# Patient Record
Sex: Female | Born: 1975 | Race: White | Hispanic: No | Smoking: Former smoker
Health system: Southern US, Community
[De-identification: ages and names within clinical notes are randomized; demographics above are authoritative.]

## PROBLEM LIST (undated history)

## (undated) DIAGNOSIS — G4730 Sleep apnea, unspecified: Secondary | ICD-10-CM

## (undated) DIAGNOSIS — F419 Anxiety disorder, unspecified: Secondary | ICD-10-CM

## (undated) DIAGNOSIS — G5602 Carpal tunnel syndrome, left upper limb: Secondary | ICD-10-CM

## (undated) DIAGNOSIS — E669 Obesity, unspecified: Secondary | ICD-10-CM

## (undated) DIAGNOSIS — Z98811 Dental restoration status: Secondary | ICD-10-CM

## (undated) DIAGNOSIS — F17200 Nicotine dependence, unspecified, uncomplicated: Secondary | ICD-10-CM

## (undated) DIAGNOSIS — Z973 Presence of spectacles and contact lenses: Secondary | ICD-10-CM

## (undated) HISTORY — DX: Obesity, unspecified: E66.9

## (undated) HISTORY — DX: Anxiety disorder, unspecified: F41.9

## (undated) HISTORY — DX: Presence of spectacles and contact lenses: Z97.3

## (undated) HISTORY — DX: Nicotine dependence, unspecified, uncomplicated: F17.200

---

## 1998-07-16 ENCOUNTER — Other Ambulatory Visit: Admission: RE | Admit: 1998-07-16 | Discharge: 1998-07-16 | Payer: Self-pay | Admitting: Obstetrics and Gynecology

## 2000-08-19 ENCOUNTER — Other Ambulatory Visit: Admission: RE | Admit: 2000-08-19 | Discharge: 2000-08-19 | Payer: Self-pay | Admitting: Obstetrics and Gynecology

## 2001-08-27 ENCOUNTER — Other Ambulatory Visit: Admission: RE | Admit: 2001-08-27 | Discharge: 2001-08-27 | Payer: Self-pay | Admitting: Obstetrics and Gynecology

## 2002-08-29 ENCOUNTER — Other Ambulatory Visit: Admission: RE | Admit: 2002-08-29 | Discharge: 2002-08-29 | Payer: Self-pay | Admitting: Obstetrics and Gynecology

## 2003-08-31 ENCOUNTER — Other Ambulatory Visit: Admission: RE | Admit: 2003-08-31 | Discharge: 2003-08-31 | Payer: Self-pay | Admitting: Obstetrics and Gynecology

## 2004-09-13 ENCOUNTER — Other Ambulatory Visit: Admission: RE | Admit: 2004-09-13 | Discharge: 2004-09-13 | Payer: Self-pay | Admitting: Obstetrics and Gynecology

## 2005-09-30 ENCOUNTER — Other Ambulatory Visit: Admission: RE | Admit: 2005-09-30 | Discharge: 2005-09-30 | Payer: Self-pay | Admitting: Obstetrics and Gynecology

## 2006-04-10 ENCOUNTER — Inpatient Hospital Stay (HOSPITAL_COMMUNITY): Admission: RE | Admit: 2006-04-10 | Discharge: 2006-04-13 | Payer: Self-pay | Admitting: Obstetrics and Gynecology

## 2008-07-14 ENCOUNTER — Ambulatory Visit: Payer: Self-pay | Admitting: Family Medicine

## 2009-06-01 ENCOUNTER — Ambulatory Visit: Payer: Self-pay | Admitting: Family Medicine

## 2010-12-27 NOTE — Discharge Summary (Signed)
[name] Silva, [name] Silva                ACCOUNT NO.:  [id number]   MEDICAL RECORD NO.:  [id number]          PATIENT TYPE:  INP   LOCATION:  9118                          FACILITY:  [facility]   PHYSICIAN:  Malachi Pro. Ambrose Mantle, M.D. DATE OF BIRTH:  1975-08-31   DATE OF ADMISSION:  04/10/2006  DATE OF DISCHARGE:  04/13/2006                                 DISCHARGE SUMMARY   A 35 year old white female at [redacted] weeks gestation and a breech presentation  with gestational hypertension, admitted for C-section.  The patient  underwent a low transverse cervical c-section by Dr. Jackelyn Knife, with Dr.  Ambrose Mantle assisting under spinal anesthesia with delivery of an 8-pound 1 ounce  female, with Apgars of 9 at 1 and 9 at 5 minutes.  Postpartum, the patient  did quite well.  She had no significant problems and was discharged on the  third post-op day, voiding well, passing flatus, tolerating a diet,  ambulating well.  Incision was clean and dry.  Staples were removed and  strips applied.   LABORATORY DATA:  Showed initial hemoglobin of 12.2, hematocrit 35.5, white  count 7,400, platelet count 293,000.  Creatinine was 1.0, uric acid 6.4.  Liver enzymes were normal.  LDH was 160, platelet count 293,000.  SGOT and  PT were both normal.  RPR was non-reactive.  Followup hemoglobin was 11.6.  TIH labs on the first post-op day were normal.   FINAL DIAGNOSES:  1. Intrauterine pregnancy at 39 weeks.  2. Breech presentation.  3. Pregnancy-induced high blood pressure.   OPERATION:  Low-transverse cervical c-section.   FINAL CONDITION:  Improved.  Instructions include our regular discharge  instructions.  Call with any fever above 100.4 degrees.  Call with any  unusual problems or heavy bleeding.  Refrain from heaving lifting and return  to the office in 10 days for followup examination.  Percocet 5/325, 24  tablets, one every 4 to 6 hours as needed for pain as given at discharge.      Malachi Pro. Ambrose Mantle, M.D.  Electronically Signed     TFH/MEDQ  D:  04/13/2006  T:  04/13/2006  Job:  [id number]

## 2010-12-27 NOTE — Op Note (Signed)
[name] Silva, [name] Silva                ACCOUNT NO.:  [id number]   MEDICAL RECORD NO.:  [id number]          PATIENT TYPE:  INP   LOCATION:  9199                          FACILITY:  [facility]   PHYSICIAN:  Leighton Roach Meisinger, M.D.DATE OF BIRTH:  Jun 13, 1976   DATE OF PROCEDURE:  04/10/2006  DATE OF DISCHARGE:                                 OPERATIVE REPORT   PREOPERATIVE DIAGNOSIS:  Intrauterine pregnancy at 39 weeks, breech  presentation, and gestational hypertension.   POSTOPERATIVE DIAGNOSIS:  Intrauterine pregnancy at 39 weeks, breech  presentation, and gestational hypertension.   PROCEDURE:  Primary low transverse cesarean section.   SURGEON:  Zenaida Niece, M.D.   ASSISTANT:  Malachi Pro. Ambrose Mantle, M.D.   ANESTHESIA:  Spinal.   SPECIMENS:  Placenta to cord blood collection and then to labor and  delivery.   ESTIMATED BLOOD LOSS:  800 mL.   COMPLICATIONS:  None.   FINDINGS:  The patient had normal anatomy, delivered a viable female infant  that was in the frank breech presentation with Apgars of 9 and 9, weight 8  pounds 1 ounce.   PROCEDURE IN DETAIL:  The patient was taken to the operating room and placed  in the sitting position.  Dr. Tacy Dura instilled spinal anesthesia and she was  placed in the dorsal supine position with left lateral tilt.  The abdomen  was prepped and draped in the usual sterile fashion and a Foley catheter was  inserted.  The level of her anesthesia was found to be adequate and the  abdomen was entered via a standard Pfannenstiel incision.  A disposable self-  retaining retractor was then placed.  A 4 cm transverse incision was then  made in the lower uterine segment.  A very superficial laceration was made  in the baby's right lateral side.  Once the uterus was entered, the incision  was extended bilaterally and digitally.  The baby was delivered in the frank  breech presentation.  Arms were swept across the chest easily and head  easily delivered.  The  cord was doubly clamped and cut and the mouth and  nares were suctioned and the infant handed to the awaiting pediatric team.  Cord blood was obtained.  The placenta delivered spontaneous and was sent  for cord blood collection.  The uterus was wiped dry with a clean lap pad  and all clots and debris removed.  The uterine incision was inspected and  found to be free of extensions.  The uterine incision was then closed in two  layers with the first layer being a running locking layer with #1 chromic  and the second layer being an imbricating layer, also with #1 chromic.  Adequate hemostasis was achieved.  The tubes and ovaries were inspected and  found to be normal.  The uterine incision was again inspected and found to  be hemostatic.  The subfascial space was then irrigated and made hemostatic  with electrocautery.  The fascia was closed in a running fashion starting at  both ends and meeting in the middle with 0 Vicryl.  The subcutaneous tissue  was then irrigated and made hemostatic with electrocautery.  This was then  closed with running 2-0 plain gut suture.  The skin was then closed with staples followed by a sterile dressing.  The  patient tolerated the procedure well and was taken to the recovery room in  stable condition.  Counts were correct x2.  She received Ancef 1 gram IV  after cord clamp.      Zenaida Niece, M.D.  Electronically Signed     TDM/MEDQ  D:  04/10/2006  T:  04/10/2006  Job:  [id number]

## 2010-12-27 NOTE — H&P (Signed)
[name] Silva, [name] Silva                ACCOUNT NO.:  [id number]   MEDICAL RECORD NO.:  [id number]          PATIENT TYPE:  INP   LOCATION:  NA                            FACILITY:  [facility]   PHYSICIAN:  Zenaida Niece, M.D.DATE OF BIRTH:  1976-01-29   DATE OF ADMISSION:  04/10/2006  DATE OF DISCHARGE:                                HISTORY & PHYSICAL   CHIEF COMPLAINT:  Primary cesarean section for breech presentation.   HISTORY OF PRESENT ILLNESS:  This is a 35 year old white female gravida 1,  para 0 with an EGA of [redacted] weeks by a 10-week ultrasound with a due date of  September 7th who presents for a primary cesarean section due to persistent  breech presentation.  Patient declined external cephalic version, and the  baby remained in breech presentation.  Prenatal care was essentially  uncomplicated, except for the diagnosis of breech.  She has had  intermittently elevated blood pressures, most recently 140s/90s on the day  prior to surgery.  She also had trace to 1+ proteinuria at that time.  PIH  labs were drawn with her hospital labs and reveal an elevated uric acid and  creatinine but normal platelets and normal liver function.  She was having  no symptoms.   PRENATAL LABS:  Blood type is A positive with a negative antibody screen,  RPR nonreactive, rubella immune, hepatitis B surface antigen negative, HIV  negative, gonorrhea and chlamydia negative.  One-hour Glucola was 88, and  group B strep is positive.   PAST MEDICAL HISTORY:  Negative.   PAST SURGICAL HISTORY:  Negative.   ALLERGIES:  AUGMENTIN WHICH CAUSES A RASH.   CURRENT MEDICATIONS:  None.   SOCIAL HISTORY:  Patient is married and does smoke about a half a pack of  cigarettes a day.   FAMILY HISTORY:  Noncontributory.   PHYSICAL EXAMINATION:  VITAL SIGNS:  Weight is 285 pounds, blood pressure is  140/98.  GENERAL:  This is an obese white female, gravid, in no acute distress.  NECK:  Supple without  lymphadenopathy or thyromegaly.  LUNGS:  Clear to auscultation.  HEART:  Regular rate and rhythm without murmur.  ABDOMEN:  Gravid, nontender with a fundal height of 40 cm.  EXTREMITIES:  1+ edema and are nontender, and DTR's are 2/4 and symmetric.  CERVIX:  1, 50, -3 and a breech presentation.   ASSESSMENT:  1. Intrauterine pregnancy of 39 weeks.  2. Persistent breech presentation.  Patient declines external cephalic      version and wishes to proceed with cesarean section.  Risks of cesarean      section have been discussed.  3. Gestational hypertension.   PLAN:  To admit the patient on the day of surgery for primary low transverse  cesarean section for breech presentation.  If her blood pressures remain  elevated, she may require magnesium sulfate after delivery.      Zenaida Niece, M.D.  Electronically Signed     TDM/MEDQ  D:  04/09/2006  T:  04/09/2006  Job:  [id number]

## 2011-05-19 ENCOUNTER — Encounter: Payer: Self-pay | Admitting: Family Medicine

## 2011-05-20 ENCOUNTER — Ambulatory Visit (INDEPENDENT_AMBULATORY_CARE_PROVIDER_SITE_OTHER): Payer: PRIVATE HEALTH INSURANCE | Admitting: Medical

## 2011-05-20 ENCOUNTER — Encounter: Payer: Self-pay | Admitting: Medical

## 2011-05-20 VITALS — BP 140/90 | HR 60 | Temp 97.8°F | Resp 20 | Ht 66.5 in | Wt 257.0 lb

## 2011-05-20 DIAGNOSIS — Z23 Encounter for immunization: Secondary | ICD-10-CM

## 2011-05-20 DIAGNOSIS — B354 Tinea corporis: Secondary | ICD-10-CM

## 2011-05-20 DIAGNOSIS — R030 Elevated blood-pressure reading, without diagnosis of hypertension: Secondary | ICD-10-CM

## 2011-05-20 DIAGNOSIS — F17200 Nicotine dependence, unspecified, uncomplicated: Secondary | ICD-10-CM

## 2011-05-20 DIAGNOSIS — E669 Obesity, unspecified: Secondary | ICD-10-CM

## 2011-05-20 DIAGNOSIS — Z0000 Encounter for general adult medical examination without abnormal findings: Secondary | ICD-10-CM

## 2011-05-20 DIAGNOSIS — F430 Acute stress reaction: Secondary | ICD-10-CM

## 2011-05-20 LAB — COMPREHENSIVE METABOLIC PANEL
ALT: 14 U/L (ref 0–35)
AST: 18 U/L (ref 0–37)
Albumin: 4.4 g/dL (ref 3.5–5.2)
BUN: 17 mg/dL (ref 6–23)
Calcium: 9.7 mg/dL (ref 8.4–10.5)
Chloride: 103 mEq/L (ref 96–112)
Potassium: 4.2 mEq/L (ref 3.5–5.3)

## 2011-05-20 LAB — CBC WITH DIFFERENTIAL/PLATELET
Basophils Absolute: 0 10*3/uL (ref 0.0–0.1)
Basophils Relative: 0 % (ref 0–1)
Hemoglobin: 14.8 g/dL (ref 12.0–15.0)
MCHC: 32.7 g/dL (ref 30.0–36.0)
Neutro Abs: 6.6 10*3/uL (ref 1.7–7.7)
Neutrophils Relative %: 70 % (ref 43–77)
RDW: 12.9 % (ref 11.5–15.5)

## 2011-05-20 LAB — POCT URINALYSIS DIPSTICK
Bilirubin, UA: NEGATIVE
Glucose, UA: NEGATIVE
Ketones, UA: NEGATIVE
Nitrite, UA: NEGATIVE
Spec Grav, UA: 1.025

## 2011-05-20 LAB — LIPID PANEL
Cholesterol: 237 mg/dL — ABNORMAL HIGH (ref 0–200)
Triglycerides: 108 mg/dL (ref <150)

## 2011-05-20 MED ORDER — KETOCONAZOLE 2 % EX CREA: TOPICAL_CREAM | Freq: Every day | CUTANEOUS | Status: DC

## 2011-05-20 NOTE — Progress Notes (Signed)
Subjective:   HPI  [name] Silva is a 35 y.o. female who presents for a complete physical.  Last complete physical 10 years ago, but does see gynecology regularly.    She notes problems with anxiety.  She became tearful discussing.  She notes stressors include finances, being a parent, life stressors in general.  She notes that she is a worry wart.  She does not want to be on medication, but needs help dealing with stress.  No prior treatment, eval, or counseling.    She notes gynecologist sent her to urology for hematuria, and workup showed no worrisome findings.  Had ultrasound and other studies, 2 years ago with [facility].  Sees Dr. Jackelyn Knife at [facility], had normal pap smear in June.   Last tetanus booster possibly more than 10 years ago.   Declines flu vaccine.    Reviewed their medical, surgical, family, social, medication, and allergy history and updated chart as appropriate.  Past Medical History  Diagnosis Date  . Smoker   . Wears glasses   . Obesity     Past Surgical History  Procedure Date  . Cesarean section     Family History  Problem Relation Age of Onset  . COPD Mother   . Lumbar disc disease Mother   . Cancer Mother     cervical cancer  . Diabetes Father   . Hypertension Father   . Hyperlipidemia Father   . COPD Maternal Uncle     lung  . Stroke Maternal Grandmother   . Heart disease Maternal Grandfather     History   Social History  . Marital Status: Single    Spouse Name: N/A    Number of Children: N/A  . Years of Education: N/A   Occupational History  . Not on file.   Social History Main Topics  . Smoking status: Current Everyday Smoker -- 1.0 packs/day for 15 years  . Smokeless tobacco: Never Used  . Alcohol Use: 0.5 oz/week    1 drink(s) per week  . Drug Use: No  . Sexually Active: Not on file   Other Topics Concern  . Not on file   Social History Narrative   Married, 1 child, not exercising, works in office, Corporate treasurer at  [employer]    No Known Allergies   Review of Systems Constitutional: denies fever, chills, sweats, unexpected weight change, anorexia, fatigue Allergy: negative; denies recent sneezing, itching, congestion Dermatology: +rash on right upper arm posteriorly x 42mo, not itching;  denies changing moles, lumps, new worrisome lesions ENT: no runny nose, ear pain, sore throat, hoarseness, sinus pain, teeth pain, tinnitus, hearing loss, epistaxis Cardiology: denies chest pain, palpitations, edema, orthopnea, paroxysmal nocturnal dyspnea Respiratory: denies cough, shortness of breath, dyspnea on exertion, wheezing, hemoptysis Gastroenterology: denies abdominal pain, nausea, vomiting, diarrhea, constipation, blood in stool, changes in bowel movement, dysphagia Hematology: denies bleeding or bruising problems Musculoskeletal: denies arthralgias, myalgias, joint swelling, back pain, neck pain, cramping, gait changes Ophthalmology: denies vision changes, eye redness, itching, discharge Urology: denies dysuria, difficulty urinating, hematuria, urinary frequency, urgency, incontinence Neurology: no headache, weakness, tingling, numbness, speech abnormality, memory loss, falls, dizziness Psychology: denies depressed mood, agitation, sleep problems     Objective:   Physical Exam  Filed Vitals:   05/20/11 0828  BP: 140/90  Pulse: 60  Temp: 97.8 F (36.6 C)  Resp: 20    General appearance: alert, no distress, WD/WN, white female, looks stated age, obese Skin: right upper posterior arm with 4  cm oval patch of raised erythema with slight central clearing, c/w tinea, otherwise scattered macules and papules, all benign appearing and all less than 3mm diameter on arms, torso and face HEENT: normocephalic, wearing glasses, conjunctiva/corneas normal, sclerae anicteric, PERRLA, EOMi, nares patent, no discharge or erythema, pharynx normal Oral cavity: MMM, tongue normal, teeth in good repair Neck: supple, no  lymphadenopathy, no thyromegaly, no masses, normal ROM, no bruits Chest: non tender, normal shape and expansion Heart: RRR, normal S1, S2, no murmurs Lungs: CTA bilaterally, no wheezes, rhonchi, or rales Abdomen: +bs, soft, non tender, non distended, no masses, no hepatomegaly, no splenomegaly, no bruits Back: non tender, normal ROM, no scoliosis Musculoskeletal: upper extremities non tender, no obvious deformity, normal ROM throughout, lower extremities non tender, no obvious deformity, normal ROM throughout Extremities: no edema, no cyanosis, no clubbing Pulses: 2+ symmetric, upper and lower extremities, normal cap refill Neurological: alert, oriented x 3, CN2-12 intact, strength normal upper extremities and lower extremities, sensation normal throughout, DTRs 2+ throughout, no cerebellar signs, gait normal Psychiatric: normal affect, pleasant, crying at times talking about stressors Breast/pelvic - deferred to gynecology   Assessment :    Encounter Diagnoses  Name Primary?  . Routine general medical examination at a health care facility Yes  . Need for tetanus booster   . Tobacco use disorder   . Obesity   . Acute stress reaction   . Elevated blood pressure reading without diagnosis of hypertension   . Tinea corporis     Plan:    Physical exam - discussed healthy lifestyle, diet, exercise, preventative care, vaccinations, and addressed their concerns.    Tdap booster and VIS given today.  Tobacco use disorder - discussed risks of tobacco use and advised she work on efforts to stop tobacco.  Advised 1-800-QUIT-NOW hotline.  Obesity - advised she begin exercise at least 3 days per week to start, work on diet changes, work on weight loss.  Acute stress reaction - discussed her concerns, ways to deal with stress and anxiety, advised exercise, deep breathing, consider counseling.  Gave list of counselors.  She declines any medication.    Elevated BP - will work on lifestyle  changes, recheck in 13mo.  Tinea corporis - scrip for ketoconazole cream topically  We will call with labs results and plan.

## 2011-05-20 NOTE — Progress Notes (Signed)
Addended by: Jac Canavan on: 05/20/2011 09:36 AM   Modules accepted: Orders

## 2011-05-20 NOTE — Patient Instructions (Signed)
Call 1-800-QUIT-NOW for counseling and help on stopping tobacco.  I want you to work on weight loss, improving your diet, exercising regularly.  I want to see you back in 4 months to recheck on weight and blood pressure.   Preventative Care for Adults - Female      MAINTAIN REGULAR HEALTH EXAMS:  A routine yearly physical is a good way to check in with your primary care provider about your health and preventive screening. It is also an opportunity to share updates about your health and any concerns you have, and receive a thorough all-over exam.   Most health insurance companies pay for at least some preventative services.  Check with your health plan for specific coverages.  WHAT PREVENTATIVE SERVICES DO WOMEN NEED?  Adult women should have their weight and blood pressure checked regularly.   Women age 59 and older should have their cholesterol levels checked regularly.  Women should be screened for cervical cancer with a Pap smear and pelvic exam beginning at either age 59, or 3 years after they become sexually activity.    Breast cancer screening generally begins at age 58 with a mammogram and breast exam by your primary care provider.    Beginning at age 52 and continuing to age 87, women should be screened for colorectal cancer.  Certain people may need continued testing until age 26.  Updating vaccinations is part of preventative care.  Vaccinations help protect against diseases such as the flu.  Osteoporosis is a disease in which the bones lose minerals and strength as we age. Women ages 34 and over should discuss this with their caregivers, as should women after menopause who have other risk factors.  Lab tests are generally done as part of preventative care to screen for anemia and blood disorders, to screen for problems with the kidneys and liver, to screen for bladder problems, to check blood sugar, and to check your cholesterol level.  Preventative services generally include  counseling about diet, exercise, avoiding tobacco, drugs, excessive alcohol consumption, and sexually transmitted infections.    GENERAL RECOMMENDATIONS FOR GOOD HEALTH:  Healthy diet:  Eat a variety of foods, including fruit, vegetables, animal or vegetable protein, such as meat, fish, chicken, and eggs, or beans, lentils, tofu, and grains, such as rice.  Drink plenty of water daily.  Decrease saturated fat in the diet, avoid lots of red meat, processed foods, sweets, fast foods, and fried foods.  Exercise:  Aerobic exercise helps maintain good heart health. At least 30-40 minutes of moderate-intensity exercise is recommended. For example, a brisk walk that increases your heart rate and breathing. This should be done on most days of the week.   Find a type of exercise or a variety of exercises that you enjoy so that it becomes a part of your daily life.  Examples are running, walking, swimming, water aerobics, and biking.  For motivation and support, explore group exercise such as aerobic class, spin class, Zumba, Yoga,or  martial arts, etc.    Set exercise goals for yourself, such as a certain weight goal, walk or run in a race such as a 5k walk/run.  Speak to your primary care provider about exercise goals.  Disease prevention:  If you smoke or chew tobacco, find out from your caregiver how to quit. It can literally save your life, no matter how long you have been a tobacco user. If you do not use tobacco, never begin.   Maintain a healthy diet and normal  weight. Increased weight leads to problems with blood pressure and diabetes.   The Body Mass Index or BMI is a way of measuring how much of your body is fat. Having a BMI above 27 increases the risk of heart disease, diabetes, hypertension, stroke and other problems related to obesity. Your caregiver can help determine your BMI and based on it develop an exercise and dietary program to help you achieve or maintain this important  measurement at a healthful level.  High blood pressure causes heart and blood vessel problems.  Persistent high blood pressure should be treated with medicine if weight loss and exercise do not work.   Fat and cholesterol leaves deposits in your arteries that can block them. This causes heart disease and vessel disease elsewhere in your body.  If your cholesterol is found to be high, or if you have heart disease or certain other medical conditions, then you may need to have your cholesterol monitored frequently and be treated with medication.   Ask if you should have a cardiac stress test if your history suggests this. A stress test is a test done on a treadmill that looks for heart disease. This test can find disease prior to there being a problem.  Menopause can be associated with physical symptoms and risks. Hormone replacement therapy is available to decrease these. You should talk to your caregiver about whether starting or continuing to take hormones is right for you.   Osteoporosis is a disease in which the bones lose minerals and strength as we age. This can result in serious bone fractures. Risk of osteoporosis can be identified using a bone density scan. Women ages 19 and over should discuss this with their caregivers, as should women after menopause who have other risk factors. Ask your caregiver whether you should be taking a calcium supplement and Vitamin D, to reduce the rate of osteoporosis.   Avoid drinking alcohol in excess (more than two drinks per day).  Avoid use of street drugs. Do not share needles with anyone. Ask for professional help if you need assistance or instructions on stopping the use of alcohol, cigarettes, and/or drugs.  Brush your teeth twice a day with fluoride toothpaste, and floss once a day. Good oral hygiene prevents tooth decay and gum disease. The problems can be painful, unattractive, and can cause other health problems. Visit your dentist for a routine oral  and dental check up and preventive care every 6-12 months.   Look at your skin regularly.  Use a mirror to look at your back. Notify your caregivers of changes in moles, especially if there are changes in shapes, colors, a size larger than a pencil eraser, an irregular border, or development of new moles.  Safety:  Use seatbelts 100% of the time, whether driving or as a passenger.  Use safety devices such as hearing protection if you work in environments with loud noise or significant background noise.  Use safety glasses when doing any work that could send debris in to the eyes.  Use a helmet if you ride a bike or motorcycle.  Use appropriate safety gear for contact sports.  Talk to your caregiver about gun safety.  Use sunscreen with a SPF (or skin protection factor) of 15 or greater.  Lighter skinned people are at a greater risk of skin cancer. Don't forget to also wear sunglasses in order to protect your eyes from too much damaging sunlight. Damaging sunlight can accelerate cataract formation.   Practice safe sex.  Use condoms. Condoms are used for birth control and to help reduce the spread of sexually transmitted infections (or STIs).  Some of the STIs are gonorrhea (the clap), chlamydia, syphilis, trichomonas, herpes, HPV (human papilloma virus) and HIV (human immunodeficiency virus) which causes AIDS. The herpes, HIV and HPV are viral illnesses that have no cure. These can result in disability, cancer and death.   Keep carbon monoxide and smoke detectors in your home functioning at all times. Change the batteries every 6 months or use a model that plugs into the wall.   Vaccinations:  Stay up to date with your tetanus shots and other required immunizations. You should have a booster for tetanus every 10 years. Be sure to get your flu shot every year, since 5%-20% of the [place] population comes down with the flu. The flu vaccine changes each year, so being vaccinated once is not enough. Get your  shot in the fall, before the flu season peaks.   Other vaccines to consider:  Human Papilloma Virus or HPV causes cancer of the cervix, and other infections that can be transmitted from person to person. There is a vaccine for HPV, and females should get immunized between the ages of 95 and 107. It requires a series of 3 shots.   Pneumococcal vaccine to protect against certain types of pneumonia.  This is normally recommended for adults age 86 or older.  However, adults younger than 33 years old with certain underlying conditions such as diabetes, heart or lung disease should also receive the vaccine.  Shingles vaccine to protect against Varicella Zoster if you are older than age 62, or younger than 33 years old with certain underlying illness.  Hepatitis A vaccine to protect against a form of infection of the liver by a virus acquired from food.  Hepatitis B vaccine to protect against a form of infection of the liver by a virus acquired from blood or body fluids, particularly if you work in health care.  If you plan to travel internationally, check with your local health department for specific vaccination recommendations.  Cancer Screening:  Breast cancer screening is essential to preventive care for women. All women age 26 and older should perform a breast self-exam every month. At age 53 and older, women should have their caregiver complete a breast exam each year. Women at ages 58 and older should have a mammogram (x-ray film) of the breasts. Your caregiver can discuss how often you need mammograms.    Cervical cancer screening includes taking a Pap smear (sample of cells examined under a microscope) from the cervix (end of the uterus). It also includes testing for HPV (Human Papilloma Virus, which can cause cervical cancer). Screening and a pelvic exam should begin at age 63, or 3 years after a woman becomes sexually active. Screening should occur every year, with a Pap smear but no HPV  testing, up to age 69. After age 35, you should have a Pap smear every 3 years with HPV testing, if no HPV was found previously.   Most routine colon cancer screening begins at the age of 77. On a yearly basis, doctors may provide special easy to use take-home tests to check for hidden blood in the stool. Sigmoidoscopy or colonoscopy can detect the earliest forms of colon cancer and is life saving. These tests use a small camera at the end of a tube to directly examine the colon. Speak to your caregiver about this at age 43, when routine screening begins (  and is repeated every 5 years unless early forms of pre-cancerous polyps or small growths are found).

## 2011-05-22 LAB — URINE CULTURE: Colony Count: 2000

## 2011-05-30 ENCOUNTER — Inpatient Hospital Stay (INDEPENDENT_AMBULATORY_CARE_PROVIDER_SITE_OTHER)
Admission: RE | Admit: 2011-05-30 | Discharge: 2011-05-30 | Disposition: A | Discharge status: Home / Self Care | Payer: PRIVATE HEALTH INSURANCE | Source: Ambulatory Visit | Attending: Emergency Medicine | Admitting: Emergency Medicine

## 2011-05-30 DIAGNOSIS — F411 Generalized anxiety disorder: Secondary | ICD-10-CM

## 2011-06-02 ENCOUNTER — Encounter: Payer: Self-pay | Admitting: Family Medicine

## 2011-06-02 ENCOUNTER — Ambulatory Visit (INDEPENDENT_AMBULATORY_CARE_PROVIDER_SITE_OTHER): Payer: PRIVATE HEALTH INSURANCE | Admitting: Family Medicine

## 2011-06-02 VITALS — BP 150/90 | HR 80 | Ht 66.0 in | Wt 250.0 lb

## 2011-06-02 DIAGNOSIS — F411 Generalized anxiety disorder: Secondary | ICD-10-CM

## 2011-06-02 MED ORDER — ALPRAZOLAM 0.5 MG PO TABS: 0.5000 mg | ORAL_TABLET | Freq: Three times a day (TID) | ORAL | Status: DC | PRN

## 2011-06-02 MED ORDER — CITALOPRAM HYDROBROMIDE 20 MG PO TABS: 20.0000 mg | ORAL_TABLET | Freq: Every day | ORAL | Status: DC

## 2011-06-02 NOTE — Patient Instructions (Signed)
Start Celexa at HALF TABLET each morning.  After a week, if tolerating the medication without significant side effects, you may increase to the FULL tablet. You can start taking it in the morning, but if it makes you sleepy, you can change it to taking it in the evening.  Be careful with the alprazolam--can cause sedation and slowed reflexes.  Also can be habit-forming.  Use sparingly, only if needed  Counseling is encouraged, to help you deal with your stress better.  Daily exercise is also a great form of stress reduction

## 2011-06-02 NOTE — Progress Notes (Signed)
Chief complaint: seen at [facility] 05/30/11, diagnosed with anxiety and told to follow up with family practice  At her recent CPE with Kristian Covey, she was noted to have problems with anxiety. Stressors included finances, being a parent, life stressors in general. She dthat she is a worry wart. She didn't want to be on medication at the time of her physical, but needed help dealing with stress. She was given names of counselors, but hadn't called them yet.  Since that time, her anxiety has progressed to full panic attacks.  There is family history of panic attacks in her mother.  Ears started ringing Wednesday, which led her to start having panic attacks.  It is no longer just worrying a lot, but having panic attacks--nausea, shortness of breath, chest tightness and heart racing.  On Friday, her anxiety and panic was much worse--needed to go to urgent care.  Prescribed xanax at the urgent care.  Helped, but didn't completely take the anxiety away (at 0.25mg  tablet).  Two tablets were more effective. Not feeling sedated from the medication.  She hasn't called counselors yet.  Denies depression. Having a hard time dealing with 35 year old  Past Medical History  Diagnosis Date  . Smoker   . Wears glasses   . Obesity     Past Surgical History  Procedure Date  . Cesarean section     History   Social History  . Marital Status: Single    Spouse Name: N/A    Number of Children: N/A  . Years of Education: N/A   Occupational History  . Not on file.   Social History Main Topics  . Smoking status: Current Everyday Smoker -- 1.0 packs/day for 15 years  . Smokeless tobacco: Never Used  . Alcohol Use: 0.5 oz/week    1 drink(s) per week     4 drinks per month  . Drug Use: No  . Sexually Active: Not on file   Other Topics Concern  . Not on file   Social History Narrative   Married, 1 child, not exercising, works in office, Corporate treasurer at [employer] History  Problem Relation  Age of Onset  . COPD Mother   . Lumbar disc disease Mother   . Cancer Mother     cervical cancer  . Anxiety disorder Mother   . Depression Mother   . Diabetes Father   . Hypertension Father   . Hyperlipidemia Father   . COPD Maternal Uncle     lung  . Stroke Maternal Grandmother   . Heart disease Maternal Grandfather    Current Outpatient Prescriptions on File Prior to Visit  Medication Sig Dispense Refill  . ketoconazole (NIZORAL) 2 % cream Apply topically daily.  15 g  0  . norgestimate-ethinyl estradiol (ORTHO-CYCLEN,SPRINTEC,PREVIFEM) 0.25-35 MG-MCG tablet Take 1 tablet by mouth daily.         No Known Allergies  ROS:  Denies exertional chest pain, only related to anxiety.  Denies weight changes, fevers, URI symptoms, rashes, GI complaints or other concerns  PHYSICAL EXAM: BP 150/90  Pulse 80  Ht 5\' 6"  (1.676 m)  Wt 250 lb (113.399 kg)  BMI 40.35 kg/m2  LMP 05/15/2011 Pleasant, overweight female, accompanied by her mother, both with tobacco smell on clothing.  She appears mildly anxious Normal speech, hygiene, affect, grooming and eye contact.  ASSESSMENT/PLAN: 1. Anxiety state, unspecified  citalopram (CELEXA) 20 MG tablet, ALPRAZolam (XANAX) 0.5 MG tablet   Counseled extensively  about treatments of anxiety, including medications (risks and side effects of benzodiazepines and SSRI's) and counseling.  Understands, and agreeable to taking SSRI, using benzo short-term only, and understands recommendation for counseling.  F/u in 4 weeks, sooner prn  30 minute visit, face to face, > 1/2 counseling

## 2011-06-04 ENCOUNTER — Telehealth: Payer: Self-pay | Admitting: *Deleted

## 2011-06-04 NOTE — Telephone Encounter (Signed)
She can try taking even lower dose--taking just 1/4 tablet for a couple of day, gradually increasing to 1/2 tab, and then, if able, increase to full tablet.  Use the alprazolam to help cover the increase in anxiety that the celexa may cause--I promise, this increase in anxiety is just temporary.  This medication will ultimately DECREASE her anxiety

## 2011-06-04 NOTE — Telephone Encounter (Signed)
Patient called yesterday and left message on my vm for you. Stated that the Celexa is making her face flushed and a lot more anxious than before she started the medication. She also states she has no desire to eat anything. Please advise.

## 2011-06-04 NOTE — Telephone Encounter (Signed)
Called patient and she took 1/2 today and said that she feels a bit better. She is going to continue on the 1/2 for a few more days and then try to increase to the full tablet. Instructed patient to call if any further questions.

## 2011-06-04 NOTE — Telephone Encounter (Signed)
noted 

## 2011-06-05 ENCOUNTER — Other Ambulatory Visit: Payer: Self-pay | Admitting: *Deleted

## 2011-06-05 NOTE — Telephone Encounter (Signed)
Spoke with patient, she decided not to have the phenergan called in due to the sedation factor. Dr.Knapp wanted me to tell her that the desire to eat will come back, she just needs to wait a little while longer, pt verbalized understanding.

## 2011-06-05 NOTE — Telephone Encounter (Signed)
Patient called and stated that she feels much better emotionally, feels that Celexa IS working. She still cannot eat anything and feels very nauseous, is there something you can call in for the nausea? Please advise. Thanks.

## 2011-06-05 NOTE — Telephone Encounter (Signed)
Reassure patient that the nausea will likely improve over the next few days.  She can use phenergan for nausea, but remind her it may cause sedation.  Order pended for phenergan--send if she is agreeable to using this med prn

## 2011-06-06 ENCOUNTER — Encounter: Payer: Self-pay | Admitting: Medical

## 2011-06-09 ENCOUNTER — Ambulatory Visit (INDEPENDENT_AMBULATORY_CARE_PROVIDER_SITE_OTHER): Payer: PRIVATE HEALTH INSURANCE | Admitting: Family Medicine

## 2011-06-09 ENCOUNTER — Encounter: Payer: Self-pay | Admitting: Family Medicine

## 2011-06-09 VITALS — BP 150/94 | HR 84 | Ht 66.0 in | Wt 245.0 lb

## 2011-06-09 DIAGNOSIS — F411 Generalized anxiety disorder: Secondary | ICD-10-CM

## 2011-06-09 DIAGNOSIS — R110 Nausea: Secondary | ICD-10-CM

## 2011-06-09 MED ORDER — ESOMEPRAZOLE MAGNESIUM 40 MG PO CPDR: 40.0000 mg | DELAYED_RELEASE_CAPSULE | Freq: Every day | ORAL | Status: DC

## 2011-06-09 NOTE — Progress Notes (Signed)
Started taking 1/2 tablet of Celexa 6 days ago.  Side effects have been nausea, has vomited a couple of times.  Waking up sweaty but cold.  Felt sleepy during the day, so changed a couple of days ago to taking it at bedtime--still not sleeping well.  Has been clenching her teeth, and having some jaw pain.  Hasn't been eating due to nausea.  Started taking Ensure. She is extremely worried over the fact that she hasn't been eating. She finds that her mood is worse, because she isn't living normally--feels too sick to play with her child, etc. She had declined anti-emetic Rx for fear it would make her even more sedated. Has been needing 1/2 xanax at least 2-3 times/day for her anxiety.  Not sleeping well at night  +heartburn, belching. Drinking ginger ale. Had cut back on her caffeine, but did have a regular coke yesterday. Having loose stools after eating.  Past Medical History  Diagnosis Date  . Smoker   . Wears glasses   . Obesity     Past Surgical History  Procedure Date  . Cesarean section     History   Social History  . Marital Status: Single    Spouse Name: N/A    Number of Children: N/A  . Years of Education: N/A   Occupational History  . Not on file.   Social History Main Topics  . Smoking status: Current Everyday Smoker -- 1.0 packs/day for 15 years  . Smokeless tobacco: Never Used  . Alcohol Use: 0.5 oz/week    1 drink(s) per week     4 drinks per month  . Drug Use: No  . Sexually Active: Not on file   Other Topics Concern  . 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 COPD Mother   . Lumbar disc disease Mother   . Cancer Mother     cervical cancer  . Anxiety disorder Mother   . Depression Mother   . Diabetes Father   . Hypertension Father   . Hyperlipidemia Father   . COPD Maternal Uncle     lung  . Stroke Maternal Grandmother   .  Heart disease Maternal Grandfather    Current Outpatient Prescriptions on File Prior to Visit  Medication Sig Dispense Refill  . ALPRAZolam (XANAX) 0.5 MG tablet Take 1 tablet (0.5 mg total) by mouth 3 (three) times daily as needed for anxiety.  30 tablet  0  . citalopram (CELEXA) 20 MG tablet Take 1 tablet (20 mg total) by mouth daily.  30 tablet  1  . ketoconazole (NIZORAL) 2 % cream Apply topically daily.  15 g  0  . norgestimate-ethinyl estradiol (ORTHO-CYCLEN,SPRINTEC,PREVIFEM) 0.25-35 MG-MCG tablet Take 1 tablet by mouth daily.         No Known Allergies  ROS:  Denies fevers, URI symptoms. +nausea/vomiting, loose stools. +anxiety.  Denies depression  PHYSICAL EXAM: BP 150/94  Pulse 84  Ht 5\' 6"  (1.676 m)  Wt 245 lb (111.131 kg)  BMI 39.54 kg/m2  LMP 05/15/2011 Anxious appearing female, in no distress. Occasionally belching Neck: no lymphadenopathy or thyromegaly Heart: regular rate and rhythm Lungs: clear Abdomen nontender, nondistended, no organomegaly Skin: no rash  ASSESSMENT/PLAN: 1. Anxiety state, unspecified    2. Nausea  esomeprazole (NEXIUM) 40 MG capsule   Not tolerating Celexa well due to  nausea, which is creating more anxiety for her.  We discussed options--treating nausea with anti-emetics, treating reflux symptoms with PPI, and continuing with Celexa, hoping that side effects will diminish as her anxiety improves if she is able to stay on the medication.  Discussed changing medication--specifically discussed Paxil or Zoloft (she refused to consider Prozac).  These are more sedating, and might help with her sleep.  We avoided this initially due to potential weight gain, but we can deal with that later.  She was hoping to eliminate the medication entirely, and just use xanax, but realized that wasn't a good option.  She chose to continue the Celexa for longer, continuing at the 1/2 tablet (10mg ) daily.  She didn't want anti-emetics, but was willing to try Nexium  samples to treat reflux which may be contributing to nausea (although I suspect some of the nausea is directly related to the SSRI).  She will give this a week--will call next week to change meds if not improving on this regimen. Discussed importance of counseling. Worry less about decreased appetite in the short-term--this will improve, and weight loss isn't dangerous--in general, weight loss is encouraged (but not for this reason). Discussed bland diet   (30 minute face to face visit, >1/2 in counseling)

## 2011-06-09 NOTE — Patient Instructions (Signed)
Continue 1/2 tablet Celexa at bedtime. Start Nexium samples--take in the morning Avoid caffeine, carbonated beverages Drink plenty of water, bland diet Call next week if you aren't feeling better, then we can switch from Celexa to Paxil, as discussed.  I strongly encourage you to call for counseling also help with the anxiety

## 2011-06-23 ENCOUNTER — Telehealth: Payer: Self-pay | Admitting: *Deleted

## 2011-06-23 NOTE — Telephone Encounter (Signed)
These symptoms are more likely from anxiety than they are side effects from medication.  Continue medications

## 2011-06-23 NOTE — Telephone Encounter (Signed)
Spoke with patient and she will continue on her medications, she will call if any further problems.

## 2011-06-23 NOTE — Telephone Encounter (Signed)
Patient called and stated that she has been doing well up until yesterday and today. She is experiencing a little tightness in her chest as well as some tingling in her fingers, patient wanting to know if this could this be from medication? She wanted me to let you know that she hasn't taken a xanax since Friday. Please advise. Thanks.

## 2011-06-30 ENCOUNTER — Ambulatory Visit (INDEPENDENT_AMBULATORY_CARE_PROVIDER_SITE_OTHER): Payer: PRIVATE HEALTH INSURANCE | Admitting: Family Medicine

## 2011-06-30 ENCOUNTER — Encounter: Payer: Self-pay | Admitting: Family Medicine

## 2011-06-30 VITALS — BP 130/72 | HR 72 | Ht 66.0 in | Wt 243.0 lb

## 2011-06-30 DIAGNOSIS — F411 Generalized anxiety disorder: Secondary | ICD-10-CM

## 2011-06-30 MED ORDER — CITALOPRAM HYDROBROMIDE 10 MG PO TABS: ORAL_TABLET | ORAL | Status: DC

## 2011-06-30 MED ORDER — ALPRAZOLAM 0.5 MG PO TABS: 0.5000 mg | ORAL_TABLET | Freq: Three times a day (TID) | ORAL | Status: DC | PRN

## 2011-06-30 NOTE — Progress Notes (Signed)
Patient presents to follow up on anxiety.  The extreme nausea and inability to eat that she had initially with the Celexa have resolved.  She is still only taking 1/2 tablet.  She called last week, complaining of a little tightness in her chest as well as some tingling in her fingers.  The tingling has since resolved.  Hasn't taken a xanax in 6 days, needs infrequently.  The tightness was on the left side of the chest (just to the left of midline in her breastbone), some radiation to her L arm (underside of arm).  Described as an "ache".  Comes and goes, not related to exertion or anxiety.  Occasionally she will wake up in the morning with the discomfort.  Notices the discomfort about once or twice a day, lasting about 30-45 minutes, resolving on its own.  Hasn't taken any medications to treat the pain. 3-4/10 pain at its worst.  Period was 3 days early this past month--she wasn't eating and was extremely stressed during that time.  Due again next week  She has 5 or 6 xanax left, asking for a refill--anxious about running out during the holiday  Past Medical History  Diagnosis Date  . Smoker   . Wears glasses   . Obesity   . Anxiety    Current Outpatient Prescriptions on File Prior to Visit  Medication Sig Dispense Refill  . ketoconazole (NIZORAL) 2 % cream Apply topically daily.  15 g  0  . norgestimate-ethinyl estradiol (ORTHO-CYCLEN,SPRINTEC,PREVIFEM) 0.25-35 MG-MCG tablet Take 1 tablet by mouth daily.        Marland Kitchen esomeprazole (NEXIUM) 40 MG capsule Take 1 capsule (40 mg total) by mouth daily before breakfast.  10 capsule  0   No Known Allergies  ROS:  Denies fevers, URI symptoms, cough, SOB.  Denies nausea, vomiting, diarrhea or abdominal pain. Anxiety improved, intermittent chest pain as per HPI.  No longer having shortness of breath/palpitations as she previously did, moods are better  PHYSICAL EXAM: BP 130/72  Pulse 72  Ht 5\' 6"  (1.676 m)  Wt 243 lb (110.224 kg)  BMI 39.22 kg/m2  LMP  06/09/2011 Well developed, pleasant, obese female (smelling of cigarette smoke) in no distress Normal eye contact, speech.  Appears much calmer and less anxious than at previous visit, interrupting less. Normal speech, hygiene and grooming  ASSESSMENT/PLAN: 1. Anxiety state, unspecified  ALPRAZolam (XANAX) 0.5 MG tablet, citalopram (CELEXA) 10 MG tablet   Anxiety is significantly improved, only using xanax once (or twice) a week.  Given that she had such significant nausea, will keep Celexa at 10 mg for another week or two.  If she doesn't feel that she is at her baseline yet, then she is to increase to 15 mg (1.5 tablets) for 1-2 weeks, and if tolerating it well, then the goal is to increase to 20 mg (and eventually change to a 20mg  tablet)

## 2011-06-30 NOTE — Patient Instructions (Signed)
Given that you had such significant nausea when we started the Celexa, will keep Celexa at 10 mg for another week or two.  If you don't feel that you are at your baseline yet, then increase to 15 mg (1.5 tablets) for 1-2 weeks, and if tolerating it well, then the goal is to increase to 20 mg (and eventually change to a 20mg  tablet)

## 2011-07-09 ENCOUNTER — Ambulatory Visit (INDEPENDENT_AMBULATORY_CARE_PROVIDER_SITE_OTHER): Payer: PRIVATE HEALTH INSURANCE | Admitting: Family Medicine

## 2011-07-09 ENCOUNTER — Encounter: Payer: Self-pay | Admitting: Family Medicine

## 2011-07-09 DIAGNOSIS — F411 Generalized anxiety disorder: Secondary | ICD-10-CM

## 2011-07-09 DIAGNOSIS — F17200 Nicotine dependence, unspecified, uncomplicated: Secondary | ICD-10-CM

## 2011-07-09 NOTE — Patient Instructions (Addendum)
I do not think that your chest pain is related to your heart. I suspect it is due to anxiety, but cannot exclude reflux, given the location of the burning discomfort.  You can try taking the Nexium (or other over-the-counter antacids) when you have the burning discomfort, and see if that helps it go away.  If it does, then there is likely a reflux component, and you may need to continue the antacid (and modify diet).  If there is no improvement with treating reflux, then truly it must be residual symptoms related to anxiety.  I would encourage you to try increasing the Celexa to 20mg , but if you prefer to go up more slowly by increasing to just 15 mg for a week or so, that is fine.  You will need to get a pill cutter to help you cut the 10 mg tablets, and take 1.5 tablets every day.  You may have recurrence of nausea, decreased appetite, but it may not be as severe as before, and you know that it may be short-lived and get better in the next few days to a week.  Remember that your anxiety MIGHT temporarily get worse as the dose is increased, and it is okay to use the xanax if you need to.  PLEASE quit smoking--this is your biggest risk for heart disease, as well as for blood clots, given that you take birth control

## 2011-07-09 NOTE — Progress Notes (Signed)
Patient presents with complaint of chest burning since last Friday. Prior to then, was having "ache" in chest, slightly higher and left-sided,  but since last Friday, has changed more to a burning sensation. Burning is midsternum, different from heartburn.  Is in the breastbone area, not superficial, feels "inside".  Having burning 60% of the day.  Not related to eating, stress, exertion, activity.  There doesn't seem to be a rhyme or reason, can't think of what makes it get better or worse.  Feels better lying on her stomach, than it does on her back. Never wakes up with discomfort.  Also feels better to lie on her left side rather than her right.  She is very worried that it could be due to her heart, which makes her feel even more anxious.  She had been given samples of Nexium 10/29 when she was having a lot of problems with nausea, decreased appetite and belching, but she admits that she never took it.  This morning, while she was shaving her underarms in the shower, when she lifted her left arm her vision became "spotty."--saw spots in her vision. Lasted for less than 30 seconds. No further problems with vision throughout the day.  Denies arm/shoulder pain when she lifts it.  Last panic attack was 3-4 weeks ago. Hasn't taken a xanax in about 2.5 weeks.  Not symptomatic now--denies any chest burning at present.  Past Medical History  Diagnosis Date  . Smoker   . Wears glasses   . Obesity   . Anxiety     Past Surgical History  Procedure Date  . Cesarean section     History   Social History  . Marital Status: Single    Spouse Name: N/A    Number of Children: N/A  . Years of Education: N/A   Occupational History  . Not on file.   Social History Main Topics  . Smoking status: Current Everyday Smoker -- 1.0 packs/day for 15 years  . Smokeless tobacco: Never Used  . Alcohol Use: 0.5 oz/week    1 drink(s) per week     4 drinks per month  . Drug Use: No  . Sexually Active: Not on  file   Other Topics Concern  . 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 COPD Mother   . Lumbar disc disease Mother   . Cancer Mother     cervical cancer  . Anxiety disorder Mother   . Depression Mother   . Diabetes Father   . Hypertension Father   . Hyperlipidemia Father   . COPD Maternal Uncle     lung  . Stroke Maternal Grandmother   . Heart disease Maternal Grandfather    Current Outpatient Prescriptions on File Prior to Visit  Medication Sig Dispense Refill  . ALPRAZolam (XANAX) 0.5 MG tablet Take 1 tablet (0.5 mg total) by mouth 3 (three) times daily as needed for anxiety.  30 tablet  0  . ketoconazole (NIZORAL) 2 % cream Apply topically daily.  15 g  0  . norgestimate-ethinyl estradiol (ORTHO-CYCLEN,SPRINTEC,PREVIFEM) 0.25-35 MG-MCG tablet Take 1 tablet by mouth daily.        Marland Kitchen esomeprazole (NEXIUM) 40 MG capsule Take 1 capsule (40 mg total) by mouth daily before breakfast.  10 capsule  0   No Known Allergies  ROS:  Denies fever,  URI symptoms, palpitations, bowel changes, nausea.  Denies pleuritic chest pain, shortness of breath  PHYSICAL EXAM: BP 130/88  Pulse 76  Ht 5\' 6"  (1.676 m)  Wt 243 lb (110.224 kg)  BMI 39.22 kg/m2  LMP 06/09/2011 Well developed, mildly anxious appearing female in no distress Neck: no lymphadenopathy or thyromegaly Heart: regular rate and rhythm without murmur Lungs: clear bilaterally Abdomen: soft, nontender, no organomegaly or mass Chest:  nontender Extremities: no edema Skin: no rash  ASSESSMENT/PLAN: 1. Anxiety state, unspecified   2. Tobacco use disorder    Chest burning--most likely related to ongoing anxiety, but cannot exclude GERD. Encouraged trial of Nexium or other antacid to see if she gets relief.  If not, then I strongly encouraged her to increase Celexa dose.  She had a significant amount of  nausea and decreased appetite when she started medications, so she prefers to go up slowly. Discussed increased to 15mg  rather than going straight up to 20mg  daily.  She may use xanax prn  Discussed the fact that she is extremely worried about her heart, yet she continues to smoke.  Discussed that her smoking, in addition to her use of OCP's, puts her at increased risk for heart disease, blood clots, stroke, etc.  Strongly encouraged smoking cessation, and reviewed the risks of smoking.  F/u as scheduled in 2 months, sooner prn  (25-30 minute visit, more than 1/2 spent counseling)

## 2011-09-01 ENCOUNTER — Ambulatory Visit (INDEPENDENT_AMBULATORY_CARE_PROVIDER_SITE_OTHER): Payer: PRIVATE HEALTH INSURANCE | Admitting: Family Medicine

## 2011-09-01 ENCOUNTER — Encounter: Payer: Self-pay | Admitting: Family Medicine

## 2011-09-01 DIAGNOSIS — F411 Generalized anxiety disorder: Secondary | ICD-10-CM

## 2011-09-01 DIAGNOSIS — J069 Acute upper respiratory infection, unspecified: Secondary | ICD-10-CM

## 2011-09-01 DIAGNOSIS — F17200 Nicotine dependence, unspecified, uncomplicated: Secondary | ICD-10-CM

## 2011-09-01 MED ORDER — CITALOPRAM HYDROBROMIDE 10 MG PO TABS: 10.0000 mg | ORAL_TABLET | Freq: Every day | ORAL | Status: DC

## 2011-09-01 NOTE — Progress Notes (Signed)
Chief complaint:   2 month follow up. Also if you have time, patient woke up with ST and stuffy nose this past Saturday. No fever, no body aches.   HPI: Patient presents to follow up on anxiety.  She admits that she never increased the Celexa dose above 10mg .  She no longer is having the same midsternal chest pain, but intermittently has discomfort at her L upper chest.  Anxiety is doing well; last panic attack was 2-3 weeks ago, although she "almost" had one earlier in the week when she got dizzy in the middle of the night.  Awoke with room spinning.  The following day she started with congestion, sore throat, runny nose, and slight cough from PND.  Dizziness resolved.  Period is heavier, but not clotting (seems thinner, not as thick), and last longer.  Not having cramps as often.  PMS is better.  Past Medical History  Diagnosis Date  . Smoker   . Wears glasses   . Obesity   . Anxiety     Past Surgical History  Procedure Date  . Cesarean section     History   Social History  . Marital Status: Single    Spouse Name: N/A    Number of Children: N/A  . Years of Education: N/A   Occupational History  . Not on file.   Social History Main Topics  . Smoking status: Current Everyday Smoker -- 1.0 packs/day for 15 years  . Smokeless tobacco: Never Used  . Alcohol Use: 0.5 oz/week    1 drink(s) per week     4 drinks per month  . Drug Use: No  . Sexually Active: Not on file   Other Topics Concern  . 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 COPD Mother   . Lumbar disc disease Mother   . Cancer Mother     cervical cancer  . Anxiety disorder Mother   . Depression Mother   . Diabetes Father   . Hypertension Father   . Hyperlipidemia Father   . COPD Maternal Uncle     lung  . Stroke Maternal Grandmother   . Heart disease Maternal Grandfather     Current Outpatient Prescriptions on File Prior to Visit  Medication Sig Dispense Refill  . ALPRAZolam (XANAX) 0.5 MG tablet Take 1 tablet (0.5 mg total) by mouth 3 (three) times daily as needed for anxiety.  30 tablet  0  . esomeprazole (NEXIUM) 40 MG capsule Take 1 capsule (40 mg total) by mouth daily before breakfast.  10 capsule  0  . ketoconazole (NIZORAL) 2 % cream Apply topically daily.  15 g  0  . norgestimate-ethinyl estradiol (ORTHO-CYCLEN,SPRINTEC,PREVIFEM) 0.25-35 MG-MCG tablet Take 1 tablet by mouth daily.         No Known Allergies  ROS:  Denies fevers, shortness of breath, exertional chest pain (just some intermittent L-sided pain, at costochondral junction).  Denies rash, nausea, vomiting, diarrhea  PHYSICAL EXAM: BP 120/82  Pulse 68  Temp(Src) 98 F (36.7 C) (Oral)  Ht 5\' 6"  (1.676 m)  Wt 245 lb (111.131 kg)  BMI 39.54 kg/m2 Well developed, pleasant female in no distress HEENT: PERRL, EOMI, conjunctiva clear.  TM's and EAC's normal. Nasal mucosa mildly edematous with clear mucus, no erythema.  OP clear, no erythema or lesions.  Neck: no lymphadenopathy or  mass Heart: regular rate and rhythm without murmur Lungs: clear bilaterally Skin: no rash Psych: normal mood, affect, hygiene and grooming. Normal speech, eye contact  ASSESSMENT/PLAN:  1. Anxiety state, unspecified  citalopram (CELEXA) 10 MG tablet   improved, on Citalopram 10mg   2. URI (upper respiratory infection)     Anxiety--overall much improved.  Since she is doing well on the 10mg , we do not need to titrate up the dose, just continue at 10 mg.  If frequency of panic attacks increases, then recommend increasing to 15 mg, but recommend continuing at 10mg  since she is doing well.  Changes in menstrual cycle likely aren't related to the citalopram. Keep track of cycles, and if continues to have problems related to cycles, consider changing OCP's.  Encouraged again to quit smoking, and reviewed the increased  risks of smoking and OCP's, and reminded that at age 3 OCP's will NOT be prescribed if she is still smoking.  Supportive measures for URI.  May use sinus rinses, mucinex as needed or decongestants vs antihistamine to dry up nasal drainage and help with post-nasal drip

## 2011-09-01 NOTE — Patient Instructions (Signed)
Consider sinus rinses, mucinex as needed or decongestants vs antihistamine to dry up nasal drainage and help with post-nasal drip  Continue Citalopram at 10 mg.  If you start having more frequent panic attacks, then increase to 1.5 tablets.  Since you're doing pretty well now, continue at current dose of 10mg .

## 2011-10-03 ENCOUNTER — Other Ambulatory Visit: Payer: Self-pay | Admitting: Family Medicine

## 2011-10-03 DIAGNOSIS — F411 Generalized anxiety disorder: Secondary | ICD-10-CM

## 2011-10-03 NOTE — Telephone Encounter (Signed)
Please phone in as ordered

## 2011-10-03 NOTE — Telephone Encounter (Signed)
This was in refill request. Is this ok to refill?

## 2011-10-09 ENCOUNTER — Encounter: Payer: Self-pay | Admitting: Family Medicine

## 2011-10-09 ENCOUNTER — Ambulatory Visit (INDEPENDENT_AMBULATORY_CARE_PROVIDER_SITE_OTHER): Payer: PRIVATE HEALTH INSURANCE | Admitting: Family Medicine

## 2011-10-09 VITALS — BP 128/84 | HR 80 | Ht 66.0 in | Wt 244.0 lb

## 2011-10-09 DIAGNOSIS — R42 Dizziness and giddiness: Secondary | ICD-10-CM

## 2011-10-09 NOTE — Progress Notes (Signed)
Chief complaint: periodic dizzy spells that only last for a second, off and on for 1 month  HPI:  This morning while at work, had dizziness develop while reading on computer.  Vision went black, also was a little blurry, hands got clammy, felt like she was going to pass out, but it only lasted for a second. Doesn't happen every day, just sporadic.  Recalls it happening once when she was asleep and rolled over, woke her up because she felt dizzy.  Hasn't happened other times at night since then.  Not related to standing.  Not related to particular positions or head movements.  Had a similar problem last month when she had a cold, resolved after illness. LMP last Thursday.  Had diarrheal illness earlier this week, starting 4 days ago, and lasting until yesterday.    Past Medical History  Diagnosis Date  . Smoker   . Wears glasses   . Obesity   . Anxiety     Past Surgical History  Procedure Date  . Cesarean section     History   Social History  . Marital Status: Single    Spouse Name: N/A    Number of Children: N/A  . Years of Education: N/A   Occupational History  . Not on file.   Social History Main Topics  . Smoking status: Current Everyday Smoker -- 1.0 packs/day for 15 years  . Smokeless tobacco: Never Used  . Alcohol Use: 0.5 oz/week    1 drink(s) per week     4 drinks per month  . Drug Use: No  . Sexually Active: Not on file   Other Topics Concern  . 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 COPD Mother   . Lumbar disc disease Mother   . Cancer Mother     cervical cancer  . Anxiety disorder Mother   . Depression Mother   . Diabetes Father   . Hypertension Father   . Hyperlipidemia Father   . COPD Maternal Uncle     lung  . Stroke Maternal Grandmother   . Heart disease Maternal Grandfather    Current Outpatient Prescriptions on File  Prior to Visit  Medication Sig Dispense Refill  . ALPRAZolam (XANAX) 0.5 MG tablet TAKE ONE TABLET BY MOUTH THREE TIMES DAILY AS NEEDED FOR ANXIETY  30 tablet  0  . citalopram (CELEXA) 10 MG tablet Take 1 tablet (10 mg total) by mouth daily.  90 tablet  1  . norgestimate-ethinyl estradiol (ORTHO-CYCLEN,SPRINTEC,PREVIFEM) 0.25-35 MG-MCG tablet Take 1 tablet by mouth daily.        Marland Kitchen esomeprazole (NEXIUM) 40 MG capsule Take 1 capsule (40 mg total) by mouth daily before breakfast.  10 capsule  0  . ketoconazole (NIZORAL) 2 % cream Apply topically daily.  15 g  0    No Known Allergies  ROS: Had low grade fever (99.5) earlier this week when diarrhea began.  Denies nausea or vomiting.  Stools are now normal.  Denies dysuria, vaginal discharge, skin rash.  Wears glasses, less than a year old.  PHYSICAL EXAM: BP 128/84  Pulse 80  Ht 5\' 6"  (1.676 m)  Wt 244 lb (110.678 kg)  BMI 39.38 kg/m2  LMP 10/02/2011 Wel developed, pleasant female, in good spirits, in no distress HEENT: PERRL, EOMI ,conjunctiva clear.  TM's and EAC's normal.  Nasal mucosa moderately edematous with clear-white mucus.  Sinuses nontender. OP with mild cobblestoning posteriorly. Neck: no lymphadenopathy Heart: regular rate and rhythm without murmur Lungs: clear bilaterally Neuro: alert and oriented x 3.  Cranial nerves 2-12 intact.  Normal finger to nose testing.  Normal gait Skin: no rash Psych: normal mood, affect, hygiene and grooming  ASSESSMENT/PLAN:  1. Dizziness    URI vs allergies with mild vertigo.  Possibly has mild component of dehydration contributing to dizziness related to recent diarrheal illness, but most of her symptoms seem related to inner ears, likely from allergies/sinuses.   Recommend trial of claritin or zyrtec, to be used daily for the next week, or until congestion and dizzy symptoms resolve. Return for re-evaluation if worsening symptoms--ie increased duration of spells, increased frequency, nausea  and vomiting associated with the dizziness, or other concerns.   If vertigo worsens,  try meclizine (available over the counter--called Bonine)

## 2011-10-09 NOTE — Patient Instructions (Signed)
Vertigo (very mild)  Recommend trial of claritin or zyrtec, to be used daily for the next week, or until congestion and dizzy symptoms resolve. Return for re-evaluation if worsening symptoms--ie increased duration of spells, increased frequency, nausea and vomiting associated with the dizziness, or other concerns.   If vertigo worsens, you can try meclizine (available over the counter--called Bonine)

## 2011-12-15 ENCOUNTER — Other Ambulatory Visit: Payer: Self-pay | Admitting: Family Medicine

## 2012-01-10 LAB — HM PAP SMEAR: HM Pap smear: NORMAL

## 2012-03-03 ENCOUNTER — Ambulatory Visit (INDEPENDENT_AMBULATORY_CARE_PROVIDER_SITE_OTHER): Payer: PRIVATE HEALTH INSURANCE | Admitting: Family Medicine

## 2012-03-03 ENCOUNTER — Encounter: Payer: Self-pay | Admitting: Family Medicine

## 2012-03-03 VITALS — BP 130/84 | HR 64 | Ht 66.0 in | Wt 264.0 lb

## 2012-03-03 DIAGNOSIS — R7301 Impaired fasting glucose: Secondary | ICD-10-CM

## 2012-03-03 DIAGNOSIS — F17200 Nicotine dependence, unspecified, uncomplicated: Secondary | ICD-10-CM

## 2012-03-03 DIAGNOSIS — E7800 Pure hypercholesterolemia, unspecified: Secondary | ICD-10-CM

## 2012-03-03 DIAGNOSIS — F411 Generalized anxiety disorder: Secondary | ICD-10-CM

## 2012-03-03 NOTE — Patient Instructions (Signed)
I recommend long-term treatment of panic attacks with citalopram, it is working very well. If you decide you want to stop the medication, remember to slowly taper down to 1/2 tablet daily, and remain on 1/2 tablet for at least 2 months.  If any increase in anxiety, or in alprazolam use, then to go back up to the full pill, rather than tapering off completely.  Please work on quitting smoking--using the zip-loc baggie method of cutting back 1 cigarette each week, or any other method you prefer.  Utilize the free counseling available (1-800 QUITNOW; [email or web address]).

## 2012-03-03 NOTE — Progress Notes (Signed)
Chief Complaint  Patient presents with  . Follow-up    6 month follow up on anxiety.   HPI: Anxiety--"I am fine".  Doing very well on celexa.  Only needs to use alprazolam once or twice a month.  Wants to know if she can get off the medication.  Medication was started in October for panic attacks.  Hasn't had a full panic attack in over 3 months. Never had any particular trigger or stress.  She continues to smoke 1 pack daily.  Has questions about e-cigarette.  Her husband also smokes.  She got a new car, and is trying not to smoke in car.  Past Medical History  Diagnosis Date  . Smoker   . Wears glasses   . Obesity   . Anxiety    Past Surgical History  Procedure Date  . Cesarean section    History   Social History  . Marital Status: Single    Spouse Name: N/A    Number of Children: N/A  . Years of Education: N/A   Occupational History  . Not on file.   Social History Main Topics  . Smoking status: Current Everyday Smoker -- 1.0 packs/day for 15 years  . Smokeless tobacco: Never Used  . Alcohol Use: 0.5 oz/week    1 drink(s) per week     4 drinks per month  . Drug Use: No  . Sexually Active: Not on file   Other Topics Concern  . 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 ALPRAZolam (XANAX) 0.5 MG tablet TAKE ONE TABLET BY MOUTH THREE TIMES DAILY AS NEEDED FOR ANXIETY  30 tablet  0  . citalopram (CELEXA) 10 MG tablet TAKE ONE-HALF TABLET BY MOUTH EVERY DAY. IF  ONGOING  ANXIETY,  INCREASE  TO  ONE  AND  ONE-HALF  TABLETS  BY  MOUTH  DAILY,  AND  AFTER  A  60 tablet  2  . norgestimate-ethinyl estradiol (ORTHO-CYCLEN,SPRINTEC,PREVIFEM) 0.25-35 MG-MCG tablet Take 1 tablet by mouth daily.        Marland Kitchen DISCONTD: citalopram (CELEXA) 10 MG tablet Take 1 tablet (10 mg total) by mouth daily.  90 tablet  1  . DISCONTD:  citalopram (CELEXA) 20 MG tablet Take 1 tablet (20 mg total) by mouth daily.  30 tablet  1   No Known Allergies  ROS:  Denies headaches, dizziness, fever, URI or allergy symptoms, chest pain, shortness of breath, cough, skin rashes, joint pains, edema, insomnia, depression, or other concerns.  PHYSICAL EXAM: BP 130/84  Pulse 64  Ht 5\' 6"  (1.676 m)  Wt 264 lb (119.75 kg)  BMI 42.61 kg/m2  LMP 02/18/2012 Well developed, pleasant female in no distress Psych: normal mood, affect, hygiene and grooming.  Full range of affect.  Skin: no rash Neuro: alert and oriented.  Normal speech, gait  1. Anxiety state, unspecified    2. Tobacco use disorder    3. Pure hypercholesterolemia  Lipid panel  4. Impaired fasting glucose  Glucose, random    Anxiety/panic attacks.  Encouraged long-term treatment.  She seems hesitant about it, so we discussed the lack of risks of medication, low cost, tolerating without side effects. If she does in fact decide on her own to want to stop, discussed slow taper to 1/2 tablet daily, and remain on 1/2 tablet  for at least 2 months.  If any increase in anxiety, or in alprazolam use, then to go back up to the full pill, rather than tapering off completely.  Smoking cessation-- counseled extensively in available options/treatments, cutting back vs cold [place], setting quit date, how to break habits, etc. She isn't quite ready to commit to a quit date.  October labs reviewed--fasting glu 103, and elevated lipids--briefly reviewed low cholesterol diet.  F/u 6 months for med check, with fasting glucose and lipids prior  30 minute visit, all spent counseling.

## 2012-06-10 ENCOUNTER — Telehealth: Payer: Self-pay | Admitting: Family Medicine

## 2012-06-10 MED ORDER — CITALOPRAM HYDROBROMIDE 10 MG PO TABS: 10.0000 mg | ORAL_TABLET | Freq: Every day | ORAL | Status: DC

## 2012-06-10 NOTE — Telephone Encounter (Signed)
done

## 2012-06-11 ENCOUNTER — Telehealth: Payer: Self-pay | Admitting: Family Medicine

## 2012-06-11 NOTE — Telephone Encounter (Signed)
Corrected dosing on celexa pt takes BID

## 2012-06-11 NOTE — Telephone Encounter (Signed)
This was a refill of how it was initially prescribed.  How is she actually taking it? 1 BID, or 2 together?  If taking both together, we can change to a 20mg  tablet in future.  Please update the instructions in computer so future refills will have it stated correctly on rx.  Thanks

## 2012-06-11 NOTE — Telephone Encounter (Signed)
Corrected celexa in chart pt takes 1 tablet bid

## 2012-06-11 NOTE — Telephone Encounter (Signed)
Pt called and stated that her dose for this medication, celexa, is incorrect. She states that this medication should have been sent in for 2 10 mg a day. The rx states 1 a day number 60. She states number was correct but instructions were not.

## 2012-06-15 ENCOUNTER — Other Ambulatory Visit: Payer: Self-pay

## 2012-08-26 ENCOUNTER — Other Ambulatory Visit: Payer: PRIVATE HEALTH INSURANCE

## 2012-08-30 ENCOUNTER — Other Ambulatory Visit: Payer: PRIVATE HEALTH INSURANCE

## 2012-08-30 DIAGNOSIS — E7800 Pure hypercholesterolemia, unspecified: Secondary | ICD-10-CM

## 2012-08-30 DIAGNOSIS — R7301 Impaired fasting glucose: Secondary | ICD-10-CM

## 2012-08-30 LAB — LIPID PANEL
Cholesterol: 217 mg/dL — ABNORMAL HIGH (ref 0–200)
HDL: 76 mg/dL (ref >39)
Triglycerides: 121 mg/dL (ref <150)

## 2012-08-30 LAB — GLUCOSE, RANDOM: Glucose, Bld: 86 mg/dL (ref 70–99)

## 2012-09-02 ENCOUNTER — Encounter: Payer: Self-pay | Admitting: Family Medicine

## 2012-09-02 ENCOUNTER — Ambulatory Visit (INDEPENDENT_AMBULATORY_CARE_PROVIDER_SITE_OTHER): Payer: PRIVATE HEALTH INSURANCE | Admitting: Family Medicine

## 2012-09-02 VITALS — BP 130/86 | HR 80 | Ht 66.0 in | Wt 284.0 lb

## 2012-09-02 DIAGNOSIS — F411 Generalized anxiety disorder: Secondary | ICD-10-CM

## 2012-09-02 DIAGNOSIS — Z0000 Encounter for general adult medical examination without abnormal findings: Secondary | ICD-10-CM

## 2012-09-02 DIAGNOSIS — E669 Obesity, unspecified: Secondary | ICD-10-CM

## 2012-09-02 DIAGNOSIS — F17200 Nicotine dependence, unspecified, uncomplicated: Secondary | ICD-10-CM

## 2012-09-02 LAB — POCT URINALYSIS DIPSTICK
Bilirubin, UA: NEGATIVE
Glucose, UA: NEGATIVE
Ketones, UA: NEGATIVE
Nitrite, UA: NEGATIVE

## 2012-09-02 MED ORDER — CITALOPRAM HYDROBROMIDE 10 MG PO TABS: 10.0000 mg | ORAL_TABLET | Freq: Two times a day (BID) | ORAL | Status: DC

## 2012-09-02 NOTE — Progress Notes (Signed)
Chief Complaint  Patient presents with  . Annual Exam    nonfasting annual exam, labs already done. No gyn-sees Dr.Meisinger and is up to date. Pt declines flu shot today. No major concerns.   [name] Silva is a 37 y.o. female who presents for a complete physical.  She has the following concerns:  URI with cough x 2 weeks.  +sick contact from daughter.  +smoking.  Thinking about quitting with her husband, but also doesn't really seem too motivated.  Willing to have her husband get a vasectomy so she doesn't need to take OCP's anymore (knowing that GYN won't refill once she is 37 if smoking)  F/u anxiety.  She is only taking citalopram 10mg  daily.  Only needs alprazolam every few weeks.  Immunization History  Administered Date(s) Administered  . Tdap 05/20/2011   Last Pap smear: 01/2012 Last mammogram: never Last colonoscopy: never Last DEXA: never Ophtho: every 2 years, wears glasses Dentist: twice yearly Exercise:  None  Past Medical History  Diagnosis Date  . Smoker   . Wears glasses   . Obesity   . Anxiety     Past Surgical History  Procedure Date  . Cesarean section     History   Social History  . Marital Status: Single    Spouse Name: N/A    Number of Children: N/A  . Years of Education: N/A   Occupational History  . Not on file.   Social History Main Topics  . Smoking status: Current Every Day Smoker -- 1.0 packs/day for 15 years  . Smokeless tobacco: Never Used  . Alcohol Use: 0.5 oz/week    1 drink(s) per week     Comment: 4 drinks per month  . Drug Use: No  . Sexually Active: Yes -- Female partner(s)   Other Topics Concern  . 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 COPD Mother   . Lumbar disc disease Mother   . Cancer Mother     cervical cancer  . Anxiety disorder Mother   . Depression Mother   . Diabetes Father   .  Hypertension Father   . Hyperlipidemia Father   . COPD Maternal Uncle     lung  . Cancer Maternal Uncle     lung cancer  . Stroke Maternal Grandmother   . Heart disease Maternal Grandfather     Current outpatient prescriptions:citalopram (CELEXA) 10 MG tablet, Take 1 tablet (10 mg total) by mouth 2 (two) times daily., Disp: 60 tablet, Rfl: 5;  norgestimate-ethinyl estradiol (ORTHO-CYCLEN,SPRINTEC,PREVIFEM) 0.25-35 MG-MCG tablet, Take 1 tablet by mouth daily.  , Disp: , Rfl: ;  ALPRAZolam (XANAX) 0.5 MG tablet, TAKE ONE TABLET BY MOUTH THREE TIMES DAILY AS NEEDED FOR ANXIETY, Disp: 30 tablet, Rfl: 0  No Known Allergies  ROS:  The patient denies anorexia, fever, headaches,  vision changes, decreased hearing, ear pain, sore throat, breast concerns, chest pain, palpitations, dizziness, syncope, dyspnea on exertion, swelling, nausea, vomiting, diarrhea, constipation, abdominal pain, melena, hematochezia, indigestion/heartburn, hematuria, incontinence, dysuria, irregular menstrual cycles, vaginal discharge, odor or itch, genital lesions, joint pains, numbness, tingling, weakness, tremor, suspicious skin lesions, depression, abnormal bleeding/bruising, or enlarged lymph nodes.  +20 pounds in the last 6 months (she had lost some weight due to meds making her feel sick). Drinks sweet tea and regular cokes all day long.  R hand goes numb with driving, at work, sometimes L also. +cough, URI symptoms.  No fevers.  PHYSICAL EXAM: BP 130/86  Pulse 80  Ht 5\' 6"  (1.676 m)  Wt 284 lb (128.822 kg)  BMI 45.84 kg/m2  LMP 08/05/2012  General Appearance:    Alert, cooperative, no distress, appears stated age  Head:    Normocephalic, without obvious abnormality, atraumatic  Eyes:    PERRL, conjunctiva/corneas clear, EOM's intact, fundi    benign  Ears:    Normal TM's and external ear canals  Nose:   Nares normal, mucosa normal, no drainage or sinus   tenderness  Throat:   Lips, mucosa, and tongue normal;  teeth and gums normal  Neck:   Supple, no lymphadenopathy;  thyroid:  no   enlargement/tenderness/nodules; no carotid   bruit or JVD  Back:    Spine nontender, no curvature, ROM normal, no CVA     tenderness  Lungs:     Clear to auscultation bilaterally without wheezes, rales or     ronchi; respirations unlabored  Chest Wall:    No tenderness or deformity   Heart:    Regular rate and rhythm, S1 and S2 normal, no murmur, rub   or gallop  Breast Exam:    Deferred to GYN  Abdomen:     Soft, non-tender, nondistended, normoactive bowel sounds,    no masses, no hepatosplenomegaly. Obese  Genitalia:    Deferred to GYN     Extremities:   No clubbing, cyanosis or edema  Pulses:   2+ and symmetric all extremities  Skin:   Skin color, texture, turgor normal.  Healing boil and rash at skin fold at abdomen. No other rashes/lesions.  Tattoo on back   Lymph nodes:   Cervical, supraclavicular, and axillary nodes normal  Neurologic:   CNII-XII intact, normal strength, sensation and gait; reflexes 2+ and symmetric throughout          Psych:   Normal mood, affect, hygiene and grooming.    Lab results:   Fasting glucose 86 Lab Results  Component Value Date   CHOL 217* 08/30/2012   HDL 76 08/30/2012   LDLCALC 117* 08/30/2012   TRIG 121 08/30/2012   CHOLHDL 2.9 08/30/2012   Urine dip: 1+ blood, trace leuks.    ASSESSMENT/PLAN:  1. Routine general medical examination at a health care facility  POCT Urinalysis Dipstick, Visual acuity screening  2. Tobacco use disorder    3. Obesity    4. Anxiety state, unspecified  citalopram (CELEXA) 10 MG tablet   Discussed monthly self breast exams and yearly mammograms after the age of 82; at least 30 minutes of aerobic activity at least 5 days/week; proper sunscreen use reviewed; healthy diet, including goals of calcium and vitamin D intake and alcohol recommendations (less than or equal to 1 drink/day) reviewed; regular seatbelt use; changing batteries in smoke  detectors.  Immunization recommendations discussed--encouraged flu shots yearly, pneumovax due to smoking.  Declined both today.  Colonoscopy recommendations reviewed, age 24.  Poss CTS--try wrist brace prn. Hematuria on urine dip--Has h/o hematuria, and GYN sent her to urologist. Negative eval per pt  Obesity-Stop sweet tea and regular sodas to help with weight. Start regular exercise, healthy diet, portion control.  Discussed Weight Watchers  Counseled re: smoking cessation, help available, risks, and reasons to do so.  Declines flu shot Declines pneumovax--advised she will need to get one next year if she is still smoking.  For now, saying she will  try to quit, so prefers to avoid shot.  F/u 1 year, sooner prn.

## 2012-09-02 NOTE — Patient Instructions (Signed)
HEALTH MAINTENANCE RECOMMENDATIONS:  It is recommended that you get at least 30 minutes of aerobic exercise at least 5 days/week (for weight loss, you may need as much as 60-90 minutes). This can be any activity that gets your heart rate up. This can be divided in 10-15 minute intervals if needed, but try and build up your endurance at least once a week.  Weight bearing exercise is also recommended twice weekly.  Eat a healthy diet with lots of vegetables, fruits and fiber.  "Colorful" foods have a lot of vitamins (ie green vegetables, tomatoes, red peppers, etc).  Limit sweet tea, regular sodas and alcoholic beverages, all of which has a lot of calories and sugar.  Up to 1 alcoholic drink daily may be beneficial for women (unless trying to lose weight, watch sugars).  Drink a lot of water.  Calcium recommendations are 1200-1500 mg daily (1500 mg for postmenopausal women or women without ovaries), and vitamin D 1000 IU daily.  This should be obtained from diet and/or supplements (vitamins), and calcium should not be taken all at once, but in divided doses.  Monthly self breast exams and yearly mammograms for women over the age of 68 is recommended.  Sunscreen of at least SPF 30 should be used on all sun-exposed parts of the skin when outside between the hours of 10 am and 4 pm (not just when at beach or pool, but even with exercise, golf, tennis, and yard work!)  Use a sunscreen that says "broad spectrum" so it covers both UVA and UVB rays, and make sure to reapply every 1-2 hours.  Remember to change the batteries in your smoke detectors when changing your clock times in the spring and fall.  Use your seat belt every time you are in a car, and please drive safely and not be distracted with cell phones and texting while driving.   PLEASE QUIT SMOKING!  You will be getting a pneumonia vaccine next year if you are still smoking.   Fasting glucose 86 Lab Results  Component Value Date   CHOL 217*  08/30/2012   HDL 76 08/30/2012   LDLCALC 117* 08/30/2012   TRIG 121 08/30/2012   CHOLHDL 2.9 08/30/2012

## 2012-09-25 ENCOUNTER — Other Ambulatory Visit: Payer: Self-pay

## 2012-12-07 ENCOUNTER — Telehealth: Payer: Self-pay | Admitting: Family Medicine
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
## 2012-12-08 ENCOUNTER — Other Ambulatory Visit: Payer: Self-pay | Admitting: *Deleted

## 2012-12-08 DIAGNOSIS — F411 Generalized anxiety disorder: Secondary | ICD-10-CM

## 2012-12-08 MED ORDER — ALPRAZOLAM 0.5 MG PO TABS: 0.2500 mg | ORAL_TABLET | Freq: Three times a day (TID) | ORAL | Status: DC | PRN

## 2012-12-08 NOTE — Telephone Encounter (Signed)
Medication phoned in per Dr.Knapp.

## 2013-05-11 ENCOUNTER — Encounter: Payer: Self-pay | Admitting: Family Medicine

## 2013-05-11 ENCOUNTER — Ambulatory Visit (INDEPENDENT_AMBULATORY_CARE_PROVIDER_SITE_OTHER): Payer: PRIVATE HEALTH INSURANCE | Admitting: Family Medicine

## 2013-05-11 VITALS — BP 122/76 | HR 72 | Ht 67.0 in | Wt 298.0 lb

## 2013-05-11 DIAGNOSIS — F17200 Nicotine dependence, unspecified, uncomplicated: Secondary | ICD-10-CM

## 2013-05-11 DIAGNOSIS — F411 Generalized anxiety disorder: Secondary | ICD-10-CM

## 2013-05-11 DIAGNOSIS — G5600 Carpal tunnel syndrome, unspecified upper limb: Secondary | ICD-10-CM

## 2013-05-11 MED ORDER — NAPROXEN 500 MG PO TABS: 500.0000 mg | ORAL_TABLET | Freq: Two times a day (BID) | ORAL | Status: DC

## 2013-05-11 MED ORDER — ALPRAZOLAM 0.5 MG PO TABS: 0.2500 mg | ORAL_TABLET | Freq: Three times a day (TID) | ORAL | Status: DC | PRN
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
## 2013-05-11 NOTE — Patient Instructions (Addendum)
Carpal Tunnel Syndrome The carpal tunnel is a narrow area located on the palm side of your wrist. The tunnel is formed by the wrist bones and ligaments. Nerves, blood vessels, and tendons pass through the carpal tunnel. Repeated wrist motion or certain diseases may cause swelling within the tunnel. This swelling pinches the main nerve in the wrist (median nerve) and causes the painful hand and arm condition called carpal tunnel syndrome. CAUSES   Repeated wrist motions.  Wrist injuries.  Certain diseases like arthritis, diabetes, alcoholism, hyperthyroidism, and kidney failure.  Obesity.  Pregnancy. SYMPTOMS   A "pins and needles" feeling in your fingers or hand.  Tingling or numbness in your fingers or hand.  An aching feeling in your entire arm.  Wrist pain that goes up your arm to your shoulder.  Pain that goes down into your palm or fingers.  A weak feeling in your hands. DIAGNOSIS  Your caregiver will take your history and perform a physical exam. An electromyography test may be needed. This test measures electrical signals sent out by the muscles. The electrical signals are usually slowed by carpal tunnel syndrome. You may also need X-rays. TREATMENT  Carpal tunnel syndrome may clear up by itself. Your caregiver may recommend a wrist splint or medicine such as a nonsteroidal anti-inflammatory medicine. Cortisone injections may help. Sometimes, surgery may be needed to free the pinched nerve.  HOME CARE INSTRUCTIONS   Take all medicine as directed by your caregiver. Only take over-the-counter or prescription medicines for pain, discomfort, or fever as directed by your caregiver.  If you were given a splint to keep your wrist from bending, wear it as directed. It is important to wear the splint at night. Wear the splint for as long as you have pain or numbness in your hand, arm, or wrist. This may take 1 to 2 months.  Rest your wrist from any activity that may be causing your  pain. If your symptoms are work-related, you may need to talk to your employer about changing to a job that does not require using your wrist.  Put ice on your wrist after long periods of wrist activity.  Put ice in a plastic bag.  Place a towel between your skin and the bag.  Leave the ice on for 15-20 minutes, 3-4 times a day.  Keep all follow-up visits as directed by your caregiver. This includes any orthopedic referrals, physical therapy, and rehabilitation. Any delay in getting necessary care could result in a delay or failure of your condition to heal. SEEK IMMEDIATE MEDICAL CARE IF:   You have new, unexplained symptoms.  Your symptoms get worse and are not helped or controlled with medicines. MAKE SURE YOU:   Understand these instructions.  Will watch your condition.  Will get help right away if you are not doing well or get worse. Document Released: 07/25/2000 Document Revised: 10/20/2011 Document Reviewed: 06/13/2011 [facility] Patient Information 2014 [place], [place].  Take the naproxen twice daily with food for up to 2 weeks.  Use the wrist braces on both wrists.  Return if symptoms not improving with these measures within a month.   Please try and quit smoking--start thinking about why/when you smoke (habit, boredom, stress) in order to come up with effective strategies to cut back or quit. Available resources to help you quit include free counseling through [facility] ([facility]Quitline.com or 1-800-QUITNOW), smoking cessation classes through [facility] (call to find out schedule), over-the-counter nicotine replacements, and e-cigarettes (although  this may not help break the hand-mouth habit).  Many insurance companies also have smoking cessation programs (which may decrease the cost of patches, meds if enrolled).  If these methods are not effective for you, and you are motivated to quit, return to discuss the possibility of prescription medications.

## 2013-06-16 ENCOUNTER — Other Ambulatory Visit: Payer: Self-pay

## 2013-07-29 ENCOUNTER — Emergency Department (INDEPENDENT_AMBULATORY_CARE_PROVIDER_SITE_OTHER): Payer: PRIVATE HEALTH INSURANCE

## 2013-07-29 ENCOUNTER — Emergency Department (HOSPITAL_COMMUNITY)
Admission: EM | Admit: 2013-07-29 | Discharge: 2013-07-29 | Disposition: A | Discharge status: Home / Self Care | Payer: PRIVATE HEALTH INSURANCE | Source: Home / Self Care | Attending: Family Medicine | Admitting: Family Medicine

## 2013-07-29 ENCOUNTER — Encounter (HOSPITAL_COMMUNITY): Payer: Self-pay | Admitting: Emergency Medicine

## 2013-07-29 DIAGNOSIS — M25569 Pain in unspecified knee: Secondary | ICD-10-CM

## 2013-07-29 DIAGNOSIS — M25529 Pain in unspecified elbow: Secondary | ICD-10-CM

## 2013-07-29 DIAGNOSIS — M25522 Pain in left elbow: Secondary | ICD-10-CM

## 2013-07-29 DIAGNOSIS — G5600 Carpal tunnel syndrome, unspecified upper limb: Secondary | ICD-10-CM

## 2013-07-29 DIAGNOSIS — M25562 Pain in left knee: Secondary | ICD-10-CM

## 2013-07-29 MED ORDER — TRAMADOL HCL 50 MG PO TABS: 50.0000 mg | ORAL_TABLET | Freq: Four times a day (QID) | ORAL | Status: DC | PRN

## 2013-07-29 MED ORDER — NAPROXEN 500 MG PO TABS: 500.0000 mg | ORAL_TABLET | Freq: Two times a day (BID) | ORAL | Status: DC

## 2013-07-29 NOTE — ED Provider Notes (Signed)
CSN: [id number]     Arrival date & time 07/29/13  1859 History   First MD Initiated Contact with Patient 07/29/13 1925     Chief Complaint  Patient presents with  . Knee Injury  . Elbow Injury   (Consider location/radiation/quality/duration/timing/severity/associated sxs/prior Treatment) HPI Comments: 37 year old female presents complaining of left knee and elbow pain. Earlier today, she was walking up a metal ramp when the ramp collapsed underneath her. She had immediate pain in the knee and scraped the front of her knee. She has been able to bear weight without much trouble. Later in the day, she put her elbow down on the table and noted elbow tenderness and swelling in a very discrete spot on her left elbow. She has not tried to take anything for the pain. She just wants to make sure she is not do any serious damage.   Past Medical History  Diagnosis Date  . Smoker   . Wears glasses   . Obesity   . Anxiety    Past Surgical History  Procedure Laterality Date  . Cesarean section     Family History  Problem Relation Age of Onset  . COPD Mother   . Lumbar disc disease Mother   . Cancer Mother     cervical cancer  . Anxiety disorder Mother   . Depression Mother   . Diabetes Father   . Hypertension Father   . Hyperlipidemia Father   . COPD Maternal Uncle     lung  . Cancer Maternal Uncle     lung cancer  . Stroke Maternal Grandmother   . Heart disease Maternal Grandfather    History  Substance Use Topics  . Smoking status: Current Every Day Smoker -- 1.00 packs/day for 15 years  . Smokeless tobacco: Never Used  . Alcohol Use: 0.5 oz/week    1 drink(s) per week     Comment: 4 drinks per month   OB History   Grav Para Term Preterm Abortions TAB SAB Ect Mult Living                 Review of Systems  Constitutional: Negative for fever and chills.  Eyes: Negative for visual disturbance.  Respiratory: Negative for cough and shortness of breath.   Cardiovascular:  Negative for chest pain, palpitations and leg swelling.  Gastrointestinal: Negative for nausea, vomiting and abdominal pain.  Endocrine: Negative for polydipsia and polyuria.  Genitourinary: Negative for dysuria, urgency and frequency.  Musculoskeletal: Positive for arthralgias.       See history of present illness  Skin: Negative for rash.  Neurological: Negative for dizziness, weakness and light-headedness.    Allergies  Review of patient's allergies indicates no known allergies.  Home Medications   Current Outpatient Rx  Name  Route  Sig  Dispense  Refill  . citalopram (CELEXA) 10 MG tablet   Oral   Take 1 tablet (10 mg total) by mouth 2 (two) times daily.   60 tablet   5   . ALPRAZolam (XANAX) 0.5 MG tablet   Oral   Take 0.5-1 tablets (0.25-0.5 mg total) by mouth 3 (three) times daily as needed for sleep or anxiety.   30 tablet   0   . naproxen (NAPROSYN) 500 MG tablet   Oral   Take 1 tablet (500 mg total) by mouth 2 (two) times daily with a meal.   30 tablet   0   . norethindrone (NORA-BE) 0.35 MG tablet   Oral  Take 1 tablet by mouth daily.         . traMADol (ULTRAM) 50 MG tablet   Oral   Take 1 tablet (50 mg total) by mouth every 6 (six) hours as needed.   15 tablet   0    BP 141/89  Pulse 88  Temp(Src) 98.4 F (36.9 C) (Oral)  Resp 18  SpO2 100%  LMP 07/10/2013 Physical Exam  Nursing note and vitals reviewed. Constitutional: She is oriented to person, place, and time. Vital signs are normal. She appears well-developed and well-nourished. No distress.  Morbid obesity  HENT:  Head: Normocephalic and atraumatic.  Pulmonary/Chest: Effort normal. No respiratory distress.  Musculoskeletal:       Left elbow: She exhibits normal range of motion, no swelling, no effusion, no deformity and no laceration. Tenderness (tenderness at the proximal ulna, with very mild swelling) found.       Left knee: She exhibits abnormal patellar mobility and bony  tenderness (Over the patella). She exhibits normal range of motion, no swelling, no effusion, no deformity, no erythema, normal alignment and normal meniscus. Tenderness found. Patellar tendon tenderness noted.  Palpable crepitus and the patella/patellar tendon; landmarks difficult to discern due to patient's body habitus  Neurological: She is alert and oriented to person, place, and time. She has normal strength. Coordination normal.  Skin: Skin is warm and dry. No rash noted. She is not diaphoretic.  Psychiatric: She has a normal mood and affect. Judgment normal.    ED Course  Procedures (including critical care time) Labs Review Labs Reviewed - No data to display Imaging Review Dg Knee Complete 4 Views Left  07/29/2013   CLINICAL DATA:  Fall, left knee pain  EXAM: LEFT KNEE - COMPLETE 4+ VIEW  COMPARISON:  None.  FINDINGS: No fracture or dislocation is seen.  The joint spaces are preserved.  No definite suprapatellar knee joint effusion.  IMPRESSION: No fracture or dislocation is seen.   Electronically Signed   By: Charline Bills M.D.   On: 07/29/2013 20:04      MDM   1. Knee pain, left   2. Elbow pain, left       X-rays negative. RICE, followup when necessary   Meds ordered this encounter  Medications  . naproxen (NAPROSYN) 500 MG tablet    Sig: Take 1 tablet (500 mg total) by mouth 2 (two) times daily with a meal.    Dispense:  30 tablet    Refill:  0    Order Specific Question:  Supervising Provider    Answer:  Linna Hoff [phone]  . traMADol (ULTRAM) 50 MG tablet    Sig: Take 1 tablet (50 mg total) by mouth every 6 (six) hours as needed.    Dispense:  15 tablet    Refill:  0    Order Specific Question:  Supervising Provider    Answer:  Bradd Canary D [[id number]]      Graylon Good, PA-C 07/29/13 2012

## 2013-07-29 NOTE — ED Notes (Signed)
C/o left knee and elbow injury.  Pt states that she was walking up ramp and it broke. Mild swelling and pain.  No otc meds taken for pain.

## 2013-07-30 NOTE — ED Provider Notes (Signed)
Medical screening examination/treatment/procedure(s) were performed by a resident physician or non-physician practitioner and as the supervising physician I was immediately available for consultation/collaboration.  Clementeen Graham, MD   Rodolph Bong, MD 07/30/13 [phone]

## 2013-08-10 ENCOUNTER — Encounter: Payer: Self-pay | Admitting: Medical

## 2013-08-10 ENCOUNTER — Ambulatory Visit (INDEPENDENT_AMBULATORY_CARE_PROVIDER_SITE_OTHER): Payer: PRIVATE HEALTH INSURANCE | Admitting: Medical

## 2013-08-10 VITALS — BP 100/80 | HR 76 | Temp 98.2°F | Resp 18 | Wt 296.0 lb

## 2013-08-10 DIAGNOSIS — IMO0002 Reserved for concepts with insufficient information to code with codable children: Secondary | ICD-10-CM

## 2013-08-10 DIAGNOSIS — S39012A Strain of muscle, fascia and tendon of lower back, initial encounter: Secondary | ICD-10-CM

## 2013-08-10 MED ORDER — NAPROXEN 375 MG PO TABS: 375.0000 mg | ORAL_TABLET | Freq: Two times a day (BID) | ORAL | Status: DC

## 2013-08-10 MED ORDER — CYCLOBENZAPRINE HCL 10 MG PO TABS: 10.0000 mg | ORAL_TABLET | Freq: Every day | ORAL | Status: DC

## 2013-08-10 NOTE — Progress Notes (Signed)
Subjective: Was walking out to the car this morning, slipped on ice or slippery ground.   Slid, right leg went outward, landed on left knee, felt sudden pull in back.  Went to work, but all morning stiff, aches.  She notes left low back pain, continues to worsen.   Denies head injury, no LOC, no numbness, tingling, weakness.  Took a tylenol at 9am.  No other aggravating or relieving factors.  Review of systems No chest pain, no abdominal pain, no GU changes, no blood in the urine, no knee swelling or hip pain or swelling  Past Medical History  Diagnosis Date  . Smoker   . Wears glasses   . Obesity   . Anxiety    ROS as in subjective  Objective: Gen: obese white female, nad Skin: warm, dry, no ecchymosis or erythema Neck: supple,nontender, normal ROM Back: Tender left paraspinal lumbar region to palpation, pain with flexion and extension, range of motion is slow but relatively full, otherwise nontender  MSK: Hips and knees, nontender, no swelling, normal range of motion, rest of extremities unremarkable UE and LE neurovascluar intact   Assessment: Encounter Diagnosis  Name Primary?  . Back strain, initial encounter Yes    Plan: Patient Instructions  Back strain:  Rest and don't lift anything for a few days  You can use gentle stretching, walking, some mild activity to keep the muscles loose  Heat topically is fine  No lifting over 15lb for 4-5 days at least  Begin Flexeril 1/2-1 tablet at bedtime as needed for spasm  Begin Naprosyn 375mg  with food twice daily for at least 4-5 days, then as needed  If worse,not improving within a week, or if new severe symptoms, such as pain, numbness, tingling, weakness, etc. Then recheck.

## 2013-08-10 NOTE — Patient Instructions (Signed)
Back strain:  Rest and don't lift anything for a few days  You can use gentle stretching, walking, some mild activity to keep the muscles loose  Heat topically is fine  No lifting over 15lb for 4-5 days at least  Begin Flexeril 1/2-1 tablet at bedtime as needed for spasm  Begin Naprosyn 375mg  with food twice daily for at least 4-5 days, then as needed  If worse,not improving within a week, or if new severe symptoms, such as pain, numbness, tingling, weakness, etc. Then recheck.

## 2013-09-22 ENCOUNTER — Telehealth: Payer: Self-pay | Admitting: *Deleted

## 2013-09-22 ENCOUNTER — Other Ambulatory Visit: Payer: Managed Care, Other (non HMO)

## 2013-09-22 DIAGNOSIS — R7301 Impaired fasting glucose: Secondary | ICD-10-CM

## 2013-09-22 DIAGNOSIS — Z0000 Encounter for general adult medical examination without abnormal findings: Secondary | ICD-10-CM

## 2013-09-22 DIAGNOSIS — E7800 Pure hypercholesterolemia, unspecified: Secondary | ICD-10-CM

## 2013-09-22 LAB — CBC WITH DIFFERENTIAL/PLATELET
BASOS PCT: 0 % (ref 0–1)
Basophils Absolute: 0 10*3/uL (ref 0.0–0.1)
EOS ABS: 0.2 10*3/uL (ref 0.0–0.7)
EOS PCT: 2 % (ref 0–5)
HCT: 42 % (ref 36.0–46.0)
Hemoglobin: 14.2 g/dL (ref 12.0–15.0)
LYMPHS ABS: 2.7 10*3/uL (ref 0.7–4.0)
Lymphocytes Relative: 32 % (ref 12–46)
MCH: 30.5 pg (ref 26.0–34.0)
MCHC: 33.8 g/dL (ref 30.0–36.0)
MCV: 90.1 fL (ref 78.0–100.0)
Monocytes Absolute: 0.5 10*3/uL (ref 0.1–1.0)
Monocytes Relative: 6 % (ref 3–12)
NEUTROS PCT: 60 % (ref 43–77)
Neutro Abs: 5.1 10*3/uL (ref 1.7–7.7)
PLATELETS: 352 10*3/uL (ref 150–400)
RBC: 4.66 MIL/uL (ref 3.87–5.11)
RDW: 13.7 % (ref 11.5–15.5)
WBC: 8.5 10*3/uL (ref 4.0–10.5)

## 2013-09-22 LAB — LIPID PANEL
CHOL/HDL RATIO: 3.5 ratio
CHOLESTEROL: 204 mg/dL — AB (ref 0–200)
HDL: 58 mg/dL (ref >39)
LDL Cholesterol: 133 mg/dL — ABNORMAL HIGH (ref 0–99)
TRIGLYCERIDES: 67 mg/dL (ref <150)
VLDL: 13 mg/dL (ref 0–40)

## 2013-09-22 LAB — GLUCOSE, RANDOM: Glucose, Bld: 90 mg/dL (ref 70–99)

## 2013-09-22 NOTE — Telephone Encounter (Signed)
This is one that I wouldn't have come in for labs prior to visit.... I guess do fasting glucose (IFG--had one elevated sugar in chart), fasting lipids (272.0) and a CBC (v70.0)

## 2013-09-22 NOTE — Telephone Encounter (Signed)
Patient has CPE next Monday with you, our temp that was here (Tip) scheduled a lab visit for today and never let you know. Had Jovanka draw and hold lab, can you please let me know what to order.

## 2013-09-26 ENCOUNTER — Encounter: Payer: Self-pay | Admitting: Family Medicine

## 2013-09-26 ENCOUNTER — Encounter: Payer: PRIVATE HEALTH INSURANCE | Admitting: Family Medicine

## 2013-09-26 ENCOUNTER — Ambulatory Visit (INDEPENDENT_AMBULATORY_CARE_PROVIDER_SITE_OTHER): Payer: Managed Care, Other (non HMO) | Admitting: Family Medicine

## 2013-09-26 VITALS — BP 138/84 | HR 68 | Temp 97.9°F | Ht 65.5 in | Wt 295.0 lb

## 2013-09-26 DIAGNOSIS — Z6841 Body Mass Index (BMI) 40.0 and over, adult: Secondary | ICD-10-CM

## 2013-09-26 DIAGNOSIS — R030 Elevated blood-pressure reading, without diagnosis of hypertension: Secondary | ICD-10-CM

## 2013-09-26 DIAGNOSIS — F17200 Nicotine dependence, unspecified, uncomplicated: Secondary | ICD-10-CM

## 2013-09-26 DIAGNOSIS — F411 Generalized anxiety disorder: Secondary | ICD-10-CM

## 2013-09-26 DIAGNOSIS — R635 Abnormal weight gain: Secondary | ICD-10-CM

## 2013-09-26 DIAGNOSIS — Z0000 Encounter for general adult medical examination without abnormal findings: Secondary | ICD-10-CM

## 2013-09-26 DIAGNOSIS — E7800 Pure hypercholesterolemia, unspecified: Secondary | ICD-10-CM | POA: Insufficient documentation

## 2013-09-26 DIAGNOSIS — J020 Streptococcal pharyngitis: Secondary | ICD-10-CM

## 2013-09-26 DIAGNOSIS — J029 Acute pharyngitis, unspecified: Secondary | ICD-10-CM

## 2013-09-26 DIAGNOSIS — E669 Obesity, unspecified: Secondary | ICD-10-CM

## 2013-09-26 LAB — TSH: TSH: 1.899 u[IU]/mL (ref 0.350–4.500)

## 2013-09-26 LAB — POCT URINALYSIS DIPSTICK
BILIRUBIN UA: NEGATIVE
Glucose, UA: NEGATIVE
Ketones, UA: NEGATIVE
NITRITE UA: NEGATIVE
PH UA: 5
PROTEIN UA: NEGATIVE
Spec Grav, UA: 1.02
Urobilinogen, UA: NEGATIVE

## 2013-09-26 LAB — POCT RAPID STREP A (OFFICE): Rapid Strep A Screen: POSITIVE — AB

## 2013-09-26 MED ORDER — PENICILLIN G BENZATHINE 1200000 UNIT/2ML IM SUSP
1.2000 10*6.[IU] | Freq: Once | INTRAMUSCULAR | Status: AC
  Administered 2013-09-26: 1.2 10*6.[IU] via INTRAMUSCULAR
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
## 2013-09-26 NOTE — Patient Instructions (Signed)
  HEALTH MAINTENANCE RECOMMENDATIONS:  It is recommended that you get at least 30 minutes of aerobic exercise at least 5 days/week (for weight loss, you may need as much as 60-90 minutes). This can be any activity that gets your heart rate up. This can be divided in 10-15 minute intervals if needed, but try and build up your endurance at least once a week.  Weight bearing exercise is also recommended twice weekly.  Eat a healthy diet with lots of vegetables, fruits and fiber.  "Colorful" foods have a lot of vitamins (ie green vegetables, tomatoes, red peppers, etc).  Limit sweet tea, regular sodas and alcoholic beverages, all of which has a lot of calories and sugar.  Up to 1 alcoholic drink daily may be beneficial for women (unless trying to lose weight, watch sugars).  Drink a lot of water.  Calcium recommendations are 1200-1500 mg daily (1500 mg for postmenopausal women or women without ovaries), and vitamin D 1000 IU daily.  This should be obtained from diet and/or supplements (vitamins), and calcium should not be taken all at once, but in divided doses.  Monthly self breast exams and yearly mammograms for women over the age of 36 is recommended.  Sunscreen of at least SPF 30 should be used on all sun-exposed parts of the skin when outside between the hours of 10 am and 4 pm (not just when at beach or pool, but even with exercise, golf, tennis, and yard work!)  Use a sunscreen that says "broad spectrum" so it covers both UVA and UVB rays, and make sure to reapply every 1-2 hours.  Remember to change the batteries in your smoke detectors when changing your clock times in the spring and fall.  Use your seat belt every time you are in a car, and please drive safely and not be distracted with cell phones and texting while driving.  Please try and quit smoking--start thinking about why/when you smoke (habit, boredom, stress) in order to come up with effective strategies to cut back or quit. Available  resources to help you quit include free counseling through [facility] ([place]Quitline[place] or 1-800-QUITNOW), smoking cessation classes through [facility] (call to find out schedule), over-the-counter nicotine replacements, and e-cigarettes (although this may not help break the hand-mouth habit).  Many insurance companies also have smoking cessation programs (which may decrease the cost of patches, meds if enrolled).  If these methods are not effective for you, and you are motivated to quit, return to discuss the possibility of prescription medications.  Please consider Weight Watchers. It is very important that you lose weight (and stop gaining!)  Your blood pressure was elevated today, and might ultimately require medications if you cannot lose the weight and get the BP lower.  Please work on cutting out regular sodas and teas.  Drink noncaloric beverages (deally water is best!)

## 2013-09-27 DIAGNOSIS — R030 Elevated blood-pressure reading, without diagnosis of hypertension: Secondary | ICD-10-CM | POA: Insufficient documentation

## 2013-10-02 ENCOUNTER — Other Ambulatory Visit: Payer: Self-pay | Admitting: Family Medicine

## 2013-10-03 ENCOUNTER — Encounter: Payer: Self-pay | Admitting: Family Medicine

## 2013-10-03 ENCOUNTER — Other Ambulatory Visit: Payer: Self-pay | Admitting: *Deleted

## 2013-10-03 DIAGNOSIS — F411 Generalized anxiety disorder: Secondary | ICD-10-CM

## 2013-10-03 MED ORDER — ALPRAZOLAM 0.5 MG PO TABS: 0.2500 mg | ORAL_TABLET | Freq: Three times a day (TID) | ORAL | Status: DC | PRN

## 2014-03-27 ENCOUNTER — Ambulatory Visit: Payer: Managed Care, Other (non HMO) | Admitting: Family Medicine

## 2014-04-20 ENCOUNTER — Ambulatory Visit (INDEPENDENT_AMBULATORY_CARE_PROVIDER_SITE_OTHER): Payer: Managed Care, Other (non HMO) | Admitting: Family Medicine

## 2014-04-20 ENCOUNTER — Encounter: Payer: Self-pay | Admitting: Family Medicine

## 2014-04-20 VITALS — BP 122/78 | HR 76 | Ht 66.0 in | Wt 295.0 lb

## 2014-04-20 DIAGNOSIS — E7800 Pure hypercholesterolemia, unspecified: Secondary | ICD-10-CM

## 2014-04-20 DIAGNOSIS — F411 Generalized anxiety disorder: Secondary | ICD-10-CM

## 2014-04-20 DIAGNOSIS — F17200 Nicotine dependence, unspecified, uncomplicated: Secondary | ICD-10-CM

## 2014-04-20 DIAGNOSIS — R030 Elevated blood-pressure reading, without diagnosis of hypertension: Secondary | ICD-10-CM

## 2014-04-20 DIAGNOSIS — Z6841 Body Mass Index (BMI) 40.0 and over, adult: Secondary | ICD-10-CM

## 2014-04-20 DIAGNOSIS — Z23 Encounter for immunization: Secondary | ICD-10-CM

## 2014-04-20 DIAGNOSIS — Z0000 Encounter for general adult medical examination without abnormal findings: Secondary | ICD-10-CM

## 2014-04-20 MED ORDER — ALPRAZOLAM 0.5 MG PO TABS: 0.2500 mg | ORAL_TABLET | Freq: Three times a day (TID) | ORAL | Status: DC | PRN
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
## 2014-04-20 NOTE — Patient Instructions (Signed)
Please try and quit smoking--start thinking about why/when you smoke (habit, boredom, stress) in order to come up with effective strategies to cut back or quit. Available resources to help you quit include free counseling through [facility]uitline ([place]Quitline.com or 1-800-QUITNOW), smoking cessation classes through [facility] (call to find out schedule), over-the-counter nicotine replacements, and e-cigarettes (although this may not help break the hand-mouth habit).  Many insurance companies also have smoking cessation programs (which may decrease the cost of patches, meds if enrolled).  If these methods are not effective for you, and you are motivated to quit, return to discuss the possibility of prescription medications.  Please try and continue to cut back, and try and stop smoking completely.  Please cut back on sweet tea (you can start with smaller portions, fewer servings) and eventually try and save just for special occasions, not part of your routine.  Try and incorporate regular exercise into your routine. It is recommended that you get at least 30 minutes of aerobic exercise at least 5 days/week (for weight loss, you may need as much as 60-90 minutes). This can be any activity that gets your heart rate up. This can be divided in 10-15 minute intervals if needed, but try and build up your endurance at least once a week.  Weight bearing exercise is also recommended twice weekly.  Remember to try and follow a low cholesterol diet.  We will be rechecking your cholesterol prior to your physical. Limit red meats, egg yolks, cheese and creamy items such as ice cream, creamy sauces, soups and dressings, substituting instead with oil-based salad dressings, broth-based soups, etc.  Avoid fatty/greasy foods and sweets.

## 2014-07-12 ENCOUNTER — Encounter: Payer: Self-pay | Admitting: Family Medicine

## 2014-07-12 ENCOUNTER — Ambulatory Visit (INDEPENDENT_AMBULATORY_CARE_PROVIDER_SITE_OTHER): Payer: Managed Care, Other (non HMO) | Admitting: Family Medicine

## 2014-07-12 VITALS — BP 120/70 | HR 84 | Ht 66.0 in | Wt 295.0 lb

## 2014-07-12 DIAGNOSIS — M545 Low back pain: Secondary | ICD-10-CM | POA: Diagnosis not present

## 2014-07-12 DIAGNOSIS — R8290 Unspecified abnormal findings in urine: Secondary | ICD-10-CM

## 2014-07-12 DIAGNOSIS — M62830 Muscle spasm of back: Secondary | ICD-10-CM

## 2014-07-12 LAB — POCT URINALYSIS DIPSTICK
Bilirubin, UA: NEGATIVE
Glucose, UA: NEGATIVE
Ketones, UA: NEGATIVE
NITRITE UA: NEGATIVE
PH UA: 6
PROTEIN UA: NEGATIVE
Spec Grav, UA: >=1.03
UROBILINOGEN UA: 1

## 2014-07-12 MED ORDER — KETOROLAC TROMETHAMINE 60 MG/2ML IM SOLN
60.0000 mg | Freq: Once | INTRAMUSCULAR | Status: AC
  Administered 2014-07-12: 60 mg via INTRAMUSCULAR

## 2014-07-12 MED ORDER — NAPROXEN 500 MG PO TABS: 500.0000 mg | ORAL_TABLET | Freq: Two times a day (BID) | ORAL | Status: DC

## 2014-07-12 MED ORDER — CYCLOBENZAPRINE HCL 10 MG PO TABS: 5.0000 mg | ORAL_TABLET | Freq: Three times a day (TID) | ORAL | Status: DC | PRN

## 2014-07-12 NOTE — Progress Notes (Signed)
Chief Complaint  Patient presents with  . Back Pain    pulled lower back last Friday while lifting something, twisted wrong way.    5 days ago, while moving a toy (not heavy) she felt herself turn the wrong way and had acute onset of left lower back pain.  She rested over the weekend--didn't try heat, massage or any medications.  Pain had gotten slightly better, only hurting with certain movements, but then yesterday pain got worse.  She couldn't sleep due to pain when she moved.  Pain is worse with standing, banding. Pain radiates down the back of the leg to the side of the knee when she stands or moves the leg.  No numbness/tingling or weakness of the left leg.  No bowel or bladder problems.  She denies any urinary symptoms (has had UTI's in the past, and doesn't feel like she has one)--no urgency, frequency, dysuria or visible hematuria. No vaginal discharge, odor or itch.  PMH, PSH, SH reviewed.  Outpatient Encounter Prescriptions as of 07/12/2014  Medication Sig Note  . ALPRAZolam (XANAX) 0.5 MG tablet Take 0.5-1 tablets (0.25-0.5 mg total) by mouth 3 (three) times daily as needed for sleep or anxiety.   . citalopram (CELEXA) 10 MG tablet TAKE ONE TABLET BY MOUTH TWICE DAILY 04/20/2014: Only takes 1 tablet daily  . norethindrone (NORA-BE) 0.35 MG tablet Take 1 tablet by mouth daily.    No Known Allergies  ROS:  No fever, chills, URI symptoms, cough, shortness of breath, chest pain, bleeding, bruising, rashes.  See HPI  PHYSICAL EXAM: BP 120/70 mmHg  Pulse 84  Ht 5\' 6"  (1.676 m)  Wt 295 lb (133.811 kg)  BMI 47.64 kg/m2  LMP 06/19/2014  Well developed, pleasant female in no distress.  She has moderate discomfort with certain position changes Back: no CVA tenderness or spinal tenderness.  SI joints are nontender.  Area of pain is just superior and lateral to left SI joint (muscular). Neuro: alert and oriented.  DTR's are 2+ and symmetric.  Normal strength, sensation.  Negative straight  leg raise--had pain in buttock only, but not radiating into leg. Pain with pyriformis stretch, limited ROM due to pain Abdomen:  Obese.  No suprapubic tenderness.   Color, UA yellow   Clarity, UA clear   Glucose, UA neg   Bilirubin, UA neg   Ketones, UA neg   Spec Grav, UA >=1.030   Blood, UA trace   pH, UA 6.0   Protein, UA neg   Urobilinogen, UA 1.0   Nitrite, UA neg   Leukocytes, UA large (3+)     ASSESSMENT/PLAN:  Low back pain, unspecified back pain laterality, with sciatica presence unspecified - Plan: POCT Urinalysis Dipstick, naproxen (NAPROSYN) 500 MG tablet, ketorolac (TORADOL) injection 60 mg  Muscle spasm of back - Plan: naproxen (NAPROSYN) 500 MG tablet, cyclobenzaprine (FLEXERIL) 10 MG tablet, ketorolac (TORADOL) injection 60 mg  Abnormal urinalysis - Plan: Urine culture  Low back pain due to muscle spasm, some radiculopathy (likely from pinched sciatic nerve due to pyriformis spasm).  Heat, stretches, massage, core strengthening reviewed in detail.  Proper bending/lifting.  toradol 60mg  IM given  Naproxen and flexeril.  Risks/side effects reviewed. NSAID precautions were reviewed with the patient.  Patient should take medication with food, discontinue if develops GI side effects, not to take other OTC NSAIDs at the same time, and not to use longer than recommended.  F/u prn

## 2014-07-12 NOTE — Patient Instructions (Addendum)
You were given an injection of toradol, an anti-inflammatory, in the office today.  Wait at least 6 hours before taking the prescribed naproxen.  Take the naproxen with food, twice daily regularly until your pain has completely resolved.  You are also prescribed a muscle relaxant.  This can make you very drowsy, so only take it at bedtime.  You can take it up to three time daily, but do not drive if it makes you sleepy.  Back Pain, Adult Low back pain is very common. About 1 in 5 people have back pain.The cause of low back pain is rarely dangerous. The pain often gets better over time.About half of people with a sudden onset of back pain feel better in just 2 weeks. About 8 in 10 people feel better by 6 weeks.  CAUSES Some common causes of back pain include:  Strain of the muscles or ligaments supporting the spine.  Wear and tear (degeneration) of the spinal discs.  Arthritis.  Direct injury to the back. DIAGNOSIS Most of the time, the direct cause of low back pain is not known.However, back pain can be treated effectively even when the exact cause of the pain is unknown.Answering your caregiver's questions about your overall health and symptoms is one of the most accurate ways to make sure the cause of your pain is not dangerous. If your caregiver needs more information, he or she may order lab work or imaging tests (X-rays or MRIs).However, even if imaging tests show changes in your back, this usually does not require surgery. HOME CARE INSTRUCTIONS For many people, back pain returns.Since low back pain is rarely dangerous, it is often a condition that people can learn to [facility] their own.   Remain active. It is stressful on the back to sit or stand in one place. Do not sit, drive, or stand in one place for more than 30 minutes at a time. Take short walks on level surfaces as soon as pain allows.Try to increase the length of time you walk each day.  Do not stay in bed.Resting more  than 1 or 2 days can delay your recovery.  Do not avoid exercise or work.Your body is made to move.It is not dangerous to be active, even though your back may hurt.Your back will likely heal faster if you return to being active before your pain is gone.  Pay attention to your body when you bend and lift. Many people have less discomfortwhen lifting if they bend their knees, keep the load close to their bodies,and avoid twisting. Often, the most comfortable positions are those that put less stress on your recovering back.  Find a comfortable position to sleep. Use a firm mattress and lie on your side with your knees slightly bent. If you lie on your back, put a pillow under your knees.  Only take over-the-counter or prescription medicines as directed by your caregiver. Over-the-counter medicines to reduce pain and inflammation are often the most helpful.Your caregiver may prescribe muscle relaxant drugs.These medicines help dull your pain so you can more quickly return to your normal activities and healthy exercise.  Put ice on the injured area.  Put ice in a plastic bag.  Place a towel between your skin and the bag.  Leave the ice on for 15-20 minutes, 03-04 times a day for the first 2 to 3 days. After that, ice and heat may be alternated to reduce pain and spasms.  Ask your caregiver about trying back exercises and gentle massage.  This may be of some benefit.  Avoid feeling anxious or stressed.Stress increases muscle tension and can worsen back pain.It is important to recognize when you are anxious or stressed and learn ways to manage it.Exercise is a great option. SEEK MEDICAL CARE IF:  You have pain that is not relieved with rest or medicine.  You have pain that does not improve in 1 week.  You have new symptoms.  You are generally not feeling well. SEEK IMMEDIATE MEDICAL CARE IF:   You have pain that radiates from your back into your legs.  You develop new bowel or  bladder control problems.  You have unusual weakness or numbness in your arms or legs.  You develop nausea or vomiting.  You develop abdominal pain.  You feel faint. Document Released: 07/28/2005 Document Revised: 01/27/2012 Document Reviewed: 11/29/2013 [facility] Patient Information 2015 [place], [place]. This information is not intended to replace advice given to you by your health care provider. Make sure you discuss any questions you have with your health care provider.

## 2014-07-14 LAB — URINE CULTURE
Colony Count: NO GROWTH
Organism ID, Bacteria: NO GROWTH

## 2014-08-01 ENCOUNTER — Other Ambulatory Visit: Payer: Self-pay | Admitting: Family Medicine

## 2014-08-01 NOTE — Telephone Encounter (Signed)
Check with patient--in computer, it looks like #60 with 5 refills was sent to [facility] (not [facility], where this request comes from).  She actually only takes 1/day, not two, so this should last her a year.  She shouldn't need refills (unless she is switching pharmacies, in which case rx can be transferred).  Please check with pt.

## 2014-08-01 NOTE — Telephone Encounter (Signed)
Is this okay to refill? 

## 2014-08-02 NOTE — Telephone Encounter (Signed)
Called pt and [facility] so i have sent the new rx [facility] to [facility]. i have sent enough to get her to her appt in march. i have also changed it to once a day since that's what she takes

## 2014-10-10 ENCOUNTER — Other Ambulatory Visit: Payer: Self-pay | Admitting: Family Medicine

## 2014-10-10 NOTE — Telephone Encounter (Signed)
Ok to refill #30, no add'l refill (last filled 04/2014)

## 2014-10-10 NOTE — Telephone Encounter (Signed)
Is this okay to refill? 

## 2014-10-10 NOTE — Telephone Encounter (Signed)
I called out her Xanax to her pharmacy # 30 per Dr. Lynelle DoctorKnapp

## 2014-10-17 ENCOUNTER — Other Ambulatory Visit: Payer: Managed Care, Other (non HMO)

## 2014-10-17 ENCOUNTER — Other Ambulatory Visit: Payer: PRIVATE HEALTH INSURANCE

## 2014-10-17 DIAGNOSIS — F411 Generalized anxiety disorder: Secondary | ICD-10-CM

## 2014-10-17 DIAGNOSIS — Z0000 Encounter for general adult medical examination without abnormal findings: Secondary | ICD-10-CM

## 2014-10-17 DIAGNOSIS — E7800 Pure hypercholesterolemia, unspecified: Secondary | ICD-10-CM

## 2014-10-17 LAB — CBC WITH DIFFERENTIAL/PLATELET
BASOS ABS: 0 10*3/uL (ref 0.0–0.1)
BASOS PCT: 0 % (ref 0–1)
Eosinophils Absolute: 0.1 10*3/uL (ref 0.0–0.7)
Eosinophils Relative: 2 % (ref 0–5)
HCT: 44.6 % (ref 36.0–46.0)
Hemoglobin: 14.4 g/dL (ref 12.0–15.0)
Lymphocytes Relative: 36 % (ref 12–46)
Lymphs Abs: 2.4 10*3/uL (ref 0.7–4.0)
MCH: 30.8 pg (ref 26.0–34.0)
MCHC: 32.3 g/dL (ref 30.0–36.0)
MCV: 95.3 fL (ref 78.0–100.0)
MONO ABS: 0.5 10*3/uL (ref 0.1–1.0)
MPV: 8.8 fL (ref 8.6–12.4)
Monocytes Relative: 7 % (ref 3–12)
Neutro Abs: 3.6 10*3/uL (ref 1.7–7.7)
Neutrophils Relative %: 55 % (ref 43–77)
PLATELETS: 287 10*3/uL (ref 150–400)
RBC: 4.68 MIL/uL (ref 3.87–5.11)
RDW: 13.8 % (ref 11.5–15.5)
WBC: 6.6 10*3/uL (ref 4.0–10.5)

## 2014-10-17 LAB — LIPID PANEL
CHOL/HDL RATIO: 3.7 ratio
CHOLESTEROL: 188 mg/dL (ref 0–200)
HDL: 51 mg/dL (ref >=46)
LDL Cholesterol: 124 mg/dL — ABNORMAL HIGH (ref 0–99)
Triglycerides: 67 mg/dL (ref <150)
VLDL: 13 mg/dL (ref 0–40)

## 2014-10-17 LAB — COMPREHENSIVE METABOLIC PANEL
ALK PHOS: 100 U/L (ref 39–117)
ALT: 14 U/L (ref 0–35)
AST: 15 U/L (ref 0–37)
Albumin: 4.2 g/dL (ref 3.5–5.2)
BUN: 15 mg/dL (ref 6–23)
CALCIUM: 9.2 mg/dL (ref 8.4–10.5)
CHLORIDE: 105 meq/L (ref 96–112)
CO2: 27 mEq/L (ref 19–32)
CREATININE: 0.85 mg/dL (ref 0.50–1.10)
Glucose, Bld: 86 mg/dL (ref 70–99)
Potassium: 4.3 mEq/L (ref 3.5–5.3)
Sodium: 140 mEq/L (ref 135–145)
Total Bilirubin: 0.4 mg/dL (ref 0.2–1.2)
Total Protein: 6.9 g/dL (ref 6.0–8.3)

## 2014-10-17 LAB — TSH: TSH: 1.898 u[IU]/mL (ref 0.350–4.500)

## 2014-10-19 ENCOUNTER — Encounter: Payer: Managed Care, Other (non HMO) | Admitting: Family Medicine

## 2014-10-23 ENCOUNTER — Encounter: Payer: Self-pay | Admitting: Family Medicine

## 2014-10-23 ENCOUNTER — Ambulatory Visit (INDEPENDENT_AMBULATORY_CARE_PROVIDER_SITE_OTHER): Payer: PRIVATE HEALTH INSURANCE | Admitting: Family Medicine

## 2014-10-23 ENCOUNTER — Other Ambulatory Visit: Payer: Managed Care, Other (non HMO)

## 2014-10-23 VITALS — BP 130/76 | HR 76 | Ht 66.0 in | Wt 290.8 lb

## 2014-10-23 DIAGNOSIS — L918 Other hypertrophic disorders of the skin: Secondary | ICD-10-CM | POA: Diagnosis not present

## 2014-10-23 DIAGNOSIS — Z6841 Body Mass Index (BMI) 40.0 and over, adult: Secondary | ICD-10-CM

## 2014-10-23 DIAGNOSIS — Z0000 Encounter for general adult medical examination without abnormal findings: Secondary | ICD-10-CM

## 2014-10-23 DIAGNOSIS — F17200 Nicotine dependence, unspecified, uncomplicated: Secondary | ICD-10-CM

## 2014-10-23 DIAGNOSIS — Z720 Tobacco use: Secondary | ICD-10-CM

## 2014-10-23 DIAGNOSIS — F411 Generalized anxiety disorder: Secondary | ICD-10-CM

## 2014-10-23 LAB — POCT URINALYSIS DIPSTICK
BILIRUBIN UA: NEGATIVE
Glucose, UA: NEGATIVE
Ketones, UA: NEGATIVE
NITRITE UA: NEGATIVE
Protein, UA: NEGATIVE
Spec Grav, UA: >=1.03
Urobilinogen, UA: NEGATIVE
pH, UA: 5.5

## 2014-10-23 MED ORDER — CITALOPRAM HYDROBROMIDE 10 MG PO TABS: 10.0000 mg | ORAL_TABLET | Freq: Every day | ORAL | Status: DC

## 2014-10-23 NOTE — Patient Instructions (Signed)
  HEALTH MAINTENANCE RECOMMENDATIONS:  It is recommended that you get at least 30 minutes of aerobic exercise at least 5 days/week (for weight loss, you may need as much as 60-90 minutes). This can be any activity that gets your heart rate up. This can be divided in 10-15 minute intervals if needed, but try and build up your endurance at least once a week.  Weight bearing exercise is also recommended twice weekly.  Eat a healthy diet with lots of vegetables, fruits and fiber.  "Colorful" foods have a lot of vitamins (ie green vegetables, tomatoes, red peppers, etc).  Limit sweet tea, regular sodas and alcoholic beverages, all of which has a lot of calories and sugar.  Up to 1 alcoholic drink daily may be beneficial for women (unless trying to lose weight, watch sugars).  Drink a lot of water.  Calcium recommendations are 1200-1500 mg daily (1500 mg for postmenopausal women or women without ovaries), and vitamin D 1000 IU daily.  This should be obtained from diet and/or supplements (vitamins), and calcium should not be taken all at once, but in divided doses.  Monthly self breast exams and yearly mammograms for women over the age of 40 is recommended.  Sunscreen of at least SPF 30 should be used on all sun-exposed parts of the skin when outside between the hours of 10 am and 4 pm (not just when at beach or pool, but even with exercise, golf, tennis, and yard work!)  Use a sunscreen that says "broad spectrum" so it covers both UVA and UVB rays, and make sure to reapply every 1-2 hours.  Remember to change the batteries in your smoke detectors when changing your clock times in the spring and fall.  Use your seat belt every time you are in a car, and please drive safely and not be distracted with cell phones and texting while driving.  Please try and quit smoking--start thinking about why/when you smoke (habit, boredom, stress) in order to come up with effective strategies to cut back or quit. Available  resources to help you quit include free counseling through [place] [facility] ([place]uit[facility].com or 1-800-QUITNOW), smoking cessation classes through [facility] (call to find out schedule), over-the-counter nicotine replacements, and e-cigarettes (although this may not help break the hand-mouth habit).  Many insurance companies also have smoking cessation programs (which may decrease the cost of patches, meds if enrolled).  If these methods are not effective for you, and you are motivated to quit, return to discuss the possibility of prescription medications.  

## 2014-10-23 NOTE — Progress Notes (Signed)
Chief Complaint  Patient presents with  . Annual Exam    nonfasting annual exam, had labs drawn already. No GYN care-sees Dr.Meisinger and is UTD. No concerns.   . Form    has wellness form that needs to be filled out but does not have with her-trying to get co worker to send over now, if not can we fill out later?   [name] Silva is a 39 y.o. female who presents for a complete physical.  She has the following concerns:  Anxiety: She continues to have a lot of stressors.husband has been hospitialized 3 times so far this year (sepsis once, stump infections requiring surgery twice). Currently living in a house that isn't handicapped accessible, very difficult; will be moving in 2 weeks to a handicapped accessible place. She previously needed to use alprazolam infrequently (about 2x/month), but with recent stressors has been using a couple of times/week, with good results. Denies side effects from the citalopram. No longer having any panic attacks. She is only taking 10mg /day of citalopram.  Smoking: She cut back from 1 PPD to 15 cigarettes/day over 6 months ago.  She has not cut back further. Her husband also smokes, causing complications during his hospitalizations (intubations during hospitalization), and has been told he needs to quit.  She knows she should too, to help him, but they haven't yet.    PF in left heel resolved (flared up when wearing flip flops in the summer).   Obesity: Not getting any exercise, but was walking around the hospital a lot while husband was admitted.  She is active at home--moving wheelchairs, walkers, etc, but no regular cardio.  Immunization History  Administered Date(s) Administered  . Influenza,inj,Quad PF,36+ Mos 04/20/2014  . Pneumococcal Polysaccharide-23 04/20/2014  . Tdap 05/20/2011    Last Pap smear: sees Dr. Jackelyn KnifeMeisinger yearly Last mammogram: never  Last colonoscopy: never  Last DEXA: never  Ophtho: every 2 years, wears glasses; last  06/2013 Dentist: twice yearly  Exercise: None outside of the home  Past Medical History  Diagnosis Date  . Smoker   . Wears glasses   . Obesity   . Anxiety     Past Surgical History  Procedure Laterality Date  . Cesarean section      History   Social History  . Marital Status: Married    Spouse Name: N/A  . Number of Children: 1  . Years of Education: N/A   Occupational History  . 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 Smoking status: Current Every Day Smoker -- 0.75 packs/day for 15 years    Types: Cigarettes  . Smokeless tobacco: Never Used     Comment: cut back from 1PPD to 3/4 PPD  . Alcohol Use: 0.6 oz/week    1 Standard drinks or equivalent per week     Comment: infrequent (vacations)  . Drug Use: No  . Sexual Activity:    Partners: Male    Birth Control/ Protection:      Comment: husband had vasectomy   Other Topics Concern  . 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  Husband had motorcycle accident, and had amputation of lower leg 11/2012    Family History  Problem Relation Age of Onset  . COPD Mother   . Lumbar disc disease Mother   . Cancer Mother     cervical cancer  . Anxiety disorder Mother   .  Depression Mother   . Diabetes Father   . Hypertension Father   . Hyperlipidemia Father   . COPD Maternal Uncle     lung  . Cancer Maternal Uncle     lung cancer  . Stroke Maternal Grandmother   . Heart disease Maternal Grandfather     Outpatient Encounter Prescriptions as of 10/23/2014  Medication Sig Note  . ALPRAZolam (XANAX) 0.5 MG tablet TAKE 1/2 TO 1 TABLET BY MOUTH THREE TIMES DAILY AS NEEDED FOR ANXIETY 10/23/2014: 2x/week, due to husband's recent hospitalizations/illness  . citalopram (CELEXA) 10 MG tablet Take 1 tablet (10 mg total) by mouth daily.   . [DISCONTINUED] cyclobenzaprine (FLEXERIL) 10 MG tablet Take 0.5-1 tablets (5-10 mg total) by mouth  3 (three) times daily as needed for muscle spasms. (Patient not taking: Reported on 10/23/2014)   . [DISCONTINUED] naproxen (NAPROSYN) 500 MG tablet Take 1 tablet (500 mg total) by mouth 2 (two) times daily with a meal.   . [DISCONTINUED] norethindrone (NORA-BE) 0.35 MG tablet Take 1 tablet by mouth daily.     No Known Allergies  ROS: The patient denies anorexia, headaches, vision changes, decreased hearing, ear pain, breast concerns, chest pain, palpitations, dizziness, syncope, dyspnea on exertion, swelling, nausea, vomiting, diarrhea, constipation, abdominal pain, melena, hematochezia, indigestion/heartburn, hematuria, incontinence, dysuria, irregular menstrual cycles, vaginal discharge, odor or itch, genital lesions, joint pains, weakness, tremor, suspicious skin lesions, depression, abnormal bleeding/bruising, or enlarged lymph nodes.   R>L hand goes numb with driving, at work and some other activities. She isn't compliant with wearing wrist brace. Previously she noted that she didn't have as much tingling if she wore the brace (especially at night). Admits to not wearing the brace.  Skin tag under her right arm is annoying, periodically inflames when she shaves. She would like to have it removed.  She is told that she snores.  Wakes up refreshed, not tired during the day.  PHYSICAL EXAM: BP 130/76 mmHg  Pulse 76  Ht  (1.676 m)  Wt 290 lb 12.8 oz (131.906 kg)  BMI 46.96 kg/m2  LMP 10/02/2014  General Appearance:  Alert, cooperative, no distress, appears stated age   Head:  Normocephalic, without obvious abnormality, atraumatic   Eyes:  PERRL, conjunctiva/corneas clear, EOM's intact, fundi benign.   Ears:  Normal TM's and external ear canals   Nose:  Nares normal, mucosa normal, no drainage or sinus tenderness   Throat:  Lips, mucosa, and tongue normal; teeth and gums normal.  Neck:  Supple, no lymphadenopathy; thyroid: no enlargement/tenderness/nodules; no  carotid  bruit or JVD   Back:  Spine nontender, no curvature, ROM normal, no CVA tenderness   Lungs:  Clear to auscultation bilaterally without wheezes, rales or ronchi; respirations unlabored   Chest Wall:  No tenderness or deformity   Heart:  Regular rate and rhythm, S1 and S2 normal, no murmur, rub  or gallop   Breast Exam:  Deferred to GYN   Abdomen:  Soft, non-tender, nondistended, normoactive bowel sounds,  no masses, no hepatosplenomegaly. Obese   Genitalia:  Deferred to GYN      Extremities:  No clubbing, cyanosis or edema   Pulses:  2+ and symmetric all extremities   Skin:  Skin color, texture, turgor normal. No rashes/lesions. Tattoo on back.  Non-inflamed skin tag anterior right axilla  Lymph nodes:  Cervical, supraclavicular, and axillary nodes normal   Neurologic:  CNII-XII intact, normal strength, sensation and gait; reflexes 2+ and symmetric throughout  Psych: Normal mood, affect, hygiene and grooming       Urine dip:  SG >1.030, trace blood, 1+ leuks.  Asymptomatic  Lab Results  Component Value Date   WBC 6.6 10/17/2014   HGB 14.4 10/17/2014   HCT 44.6 10/17/2014   MCV 95.3 10/17/2014   PLT 287 10/17/2014   Lab Results  Component Value Date   CHOL 188 10/17/2014   HDL 51 10/17/2014   LDLCALC 124* 10/17/2014   TRIG 67 10/17/2014   CHOLHDL 3.7 10/17/2014   Lab Results  Component Value Date   TSH 1.898 10/17/2014     Chemistry      Component Value Date/Time   NA 140 10/17/2014 0840   K 4.3 10/17/2014 0840   CL 105 10/17/2014 0840   CO2 27 10/17/2014 0840   BUN 15 10/17/2014 0840   CREATININE 0.85 10/17/2014 0840      Component Value Date/Time   CALCIUM 9.2 10/17/2014 0840   ALKPHOS 100 10/17/2014 0840   AST 15 10/17/2014 0840   ALT 14 10/17/2014 0840   BILITOT 0.4 10/17/2014 0840     Fasting glucose 86  ASSESSMENT/PLAN:  Annual physical exam - Plan: POCT Urinalysis Dipstick, Visual  acuity screening  Generalized anxiety disorder - controlled overall, no further panic attacks.  Some recent stressors.  continue current meds, prn alprazolam - Plan: citalopram (CELEXA) 10 MG tablet  Morbid obesity with BMI of 45.0-49.9, adult  Tobacco use disorder - counseled extensively.  Needs to quit along with her husband  Skin tag - R axilla, not inflamed  Skin tag--likely not covered. Various treatments reviewed. I'm happy to remove, but may not be covered by insurance.  Quit smoking--counseled extensively.  Discussed monthly self breast exams and yearly mammograms after the age of 42; at least 30 minutes of aerobic activity at least 5 days/week; proper sunscreen use reviewed; healthy diet, including goals of calcium and vitamin D intake and alcohol recommendations (less than or equal to 1 drink/day) reviewed; regular seatbelt use; changing batteries in smoke detectors.  Immunization recommendations discussed, UTD.  Colonoscopy recommendations reviewed, age 72.

## 2015-01-24 ENCOUNTER — Other Ambulatory Visit: Payer: Self-pay | Admitting: Family Medicine

## 2015-01-24 NOTE — Telephone Encounter (Signed)
Last filled #30 3 months ago (March).  Looks like frequency has increased since her last visit, where it was stated she used it 2x/wk.  If she continues to use it this frequently, will need OV to discuss her anxiety. (not currently set for 6 month check--was doing well, so set f/u for a year; will need sooner if needing xanax more frequently).  Ok for #30, no refill

## 2015-01-24 NOTE — Telephone Encounter (Signed)
Is this okay to call in? 

## 2015-01-28 ENCOUNTER — Other Ambulatory Visit: Payer: Self-pay | Admitting: Family Medicine

## 2015-04-18 ENCOUNTER — Other Ambulatory Visit: Payer: Self-pay | Admitting: Family Medicine

## 2015-04-18 NOTE — Telephone Encounter (Signed)
Ok to fill, but make sure she knows that she cannot take ibuprofen, Goody, BC powder, aleve, aspirin or any other anti-inflammatories along with the naproxen. She can take tylenol along with it, if needed. Also have her use heat, stretches and massage. Follow up here if not improving.  FYI to patient--this is about the same as taking 2 Aleve twice daily, if needed in future without rx.

## 2015-04-18 NOTE — Telephone Encounter (Signed)
Patient advised and rx sent. 

## 2015-04-18 NOTE — Telephone Encounter (Signed)
Is this okay to refill? Called patient and she having some neck pain from sleeping wrong. Tylenol and IBU not helping. She took when she hurt her back in the past and it has worked well. Does she need an appt or is this okay to fill?

## 2015-04-20 ENCOUNTER — Encounter: Payer: Self-pay | Admitting: Family Medicine

## 2015-04-20 ENCOUNTER — Ambulatory Visit (INDEPENDENT_AMBULATORY_CARE_PROVIDER_SITE_OTHER): Payer: PRIVATE HEALTH INSURANCE | Admitting: Family Medicine

## 2015-04-20 VITALS — BP 124/84 | HR 64 | Temp 99.0°F | Wt 289.4 lb

## 2015-04-20 DIAGNOSIS — M436 Torticollis: Secondary | ICD-10-CM

## 2015-04-20 MED ORDER — METHOCARBAMOL 500 MG PO TABS: 1000.0000 mg | ORAL_TABLET | Freq: Three times a day (TID) | ORAL | Status: DC | PRN

## 2015-04-20 NOTE — Patient Instructions (Signed)
You have a muscle spasm to your neck. Continue using heat for 20 minutes and doing the stretches we discussed in the office today. You can continue taking the Aleve as you have been. I have also prescribed a muscle relaxant that you can try. Do not drive, operate machinery, drink alcohol with this medication. If you are not getting any relief by Monday please call our office.    Torticollis, Acute You have suddenly (acutely) developed a twisted neck (torticollis). This is usually a self-limited condition. CAUSES  Acute torticollis may be caused by malposition, trauma or infection. Most commonly, acute torticollis is caused by sleeping in an awkward position. Torticollis may also be caused by the flexion, extension or twisting of the neck muscles beyond their normal position. Sometimes, the exact cause may not be known. SYMPTOMS  Usually, there is pain and limited movement of the neck. Your neck may twist to one side. DIAGNOSIS  The diagnosis is often made by physical examination. X-rays, CT scans or MRIs may be done if there is a history of trauma or concern of infection. TREATMENT  For a common, stiff neck that develops during sleep, treatment is focused on relaxing the contracted neck muscle. Medications (including shots) may be used to treat the problem. Most cases resolve in several days. Torticollis usually responds to conservative physical therapy. If left untreated, the shortened and spastic neck muscle can cause deformities in the face and neck. Rarely, surgery is required. HOME CARE INSTRUCTIONS   Use over-the-counter and prescription medications as directed by your caregiver.  Do stretching exercises and massage the neck as directed by your caregiver.  Follow up with physical therapy if needed and as directed by your caregiver. SEEK IMMEDIATE MEDICAL CARE IF:   You develop difficulty breathing or noisy breathing (stridor).  You drool, develop trouble swallowing or have pain with  swallowing.  You develop numbness or weakness in the hands or feet.  You have changes in speech or vision.  You have problems with urination or bowel movements.  You have difficulty walking.  You have a fever.  You have increased pain. MAKE SURE YOU:   Understand these instructions.  Will watch your condition.  Will get help right away if you are not doing well or get worse. Document Released: 07/25/2000 Document Revised: 10/20/2011 Document Reviewed: 09/05/2009 [facility] Patient Information 2015 [place], [place]. This information is not intended to replace advice given to you by your health care provider. Make sure you discuss any questions you have with your health care provider.

## 2015-04-20 NOTE — Progress Notes (Signed)
   Subjective:    Patient ID: [name] Silva, female    DOB: Jan 16, 1976, 39 y.o.   MRN: [id number]  HPI She is here for a muscle spasm to her right lateral neck. She states when she woke up 6 days ago she had stiffness to the right side of her neck which has progressively gotten worse and she now has a tingling sensation down her right arm to her elbow. She states she has tried NSAIDs, heat and stretching, as well as Sal[place] to that area without relief. She is requesting medication for pain. She denies history of neck pain or injury. Denies fever, chills, unexplained weight loss, or fatigue. She denies numbness or weakness to her upper extremities. She reports good range of motion to her right shoulder and elbow.  Reviewed allergies, medications, past medical history.   Review of Systems Pertinent positives and negatives in the history of present illness.    Objective:   Physical Exam  Constitutional: She appears well-developed and well-nourished. No distress.  HENT:  Head: Normocephalic and atraumatic.  Neck: Neck supple. Muscular tenderness present. No spinous process tenderness present. Decreased range of motion present. No erythema present.  Musculoskeletal:       Arms: She has good sensation, pulse, and strength to upper extremities.          Assessment & Plan:  Torticollis, acute  Discussed that this appears to be a muscle spasm and that there is no indication that this is related to infection or a neurological problem. Discussed options for treatment with her including cervical x-ray. We decided to continue treating this conservatively and that she would try a muscle relaxant. She will continue heat and stretching. I demonstrated several stretches that she can try. She will also continue taking the Aleve as prescribed by Dr. Lynelle Doctor and try a muscle relaxant. Provided education on safety and side effects of the medication. Encouraged her to let us know if she is not getting any  better by Monday and we could try an alternative treatment plan.

## 2015-04-30 ENCOUNTER — Ambulatory Visit (INDEPENDENT_AMBULATORY_CARE_PROVIDER_SITE_OTHER): Payer: PRIVATE HEALTH INSURANCE | Admitting: Family Medicine

## 2015-04-30 ENCOUNTER — Encounter: Payer: Self-pay | Admitting: Family Medicine

## 2015-04-30 VITALS — BP 110/80 | HR 66 | Temp 98.1°F | Resp 12 | Ht 66.0 in | Wt 282.4 lb

## 2015-04-30 DIAGNOSIS — M62838 Other muscle spasm: Secondary | ICD-10-CM

## 2015-04-30 DIAGNOSIS — Z23 Encounter for immunization: Secondary | ICD-10-CM | POA: Diagnosis not present

## 2015-04-30 DIAGNOSIS — M6248 Contracture of muscle, other site: Secondary | ICD-10-CM | POA: Diagnosis not present

## 2015-04-30 DIAGNOSIS — L02415 Cutaneous abscess of right lower limb: Secondary | ICD-10-CM | POA: Diagnosis not present

## 2015-04-30 MED ORDER — CYCLOBENZAPRINE HCL 10 MG PO TABS: 5.0000 mg | ORAL_TABLET | Freq: Every evening | ORAL | Status: DC | PRN

## 2015-04-30 MED ORDER — DOXYCYCLINE HYCLATE 100 MG PO TABS: 100.0000 mg | ORAL_TABLET | Freq: Two times a day (BID) | ORAL | Status: DC

## 2015-04-30 NOTE — Patient Instructions (Addendum)
Warm soaks at least three times daily to the abscess. There will likely be some ongoing drainage. This should lessen over the next few days. Take the antibiotics as directed. Return if increasing swelling, pain, fever or other concerns develop.  Try and avoid looking down a lot (phone, computer, reading)--try and keep things eye level. Heat, stretches as shown, massage. Try and do this at least 2-3 times/day Use flexeril at bedtime, robaxin if needed during the day as a muscle relaxant. If not improving, next step is physical therapy.  Call for referral in 1 week if not continuing to improve.  Abscess An abscess is an infected area that contains a collection of pus and debris.It can occur in almost any part of the body. An abscess is also known as a furuncle or boil. CAUSES  An abscess occurs when tissue gets infected. This can occur from blockage of oil or sweat glands, infection of hair follicles, or a minor injury to the skin. As the body tries to fight the infection, pus collects in the area and creates pressure under the skin. This pressure causes pain. People with weakened immune systems have difficulty fighting infections and get certain abscesses more often.  SYMPTOMS Usually an abscess develops on the skin and becomes a painful mass that is red, warm, and tender. If the abscess forms under the skin, you may feel a moveable soft area under the skin. Some abscesses break open (rupture) on their own, but most will continue to get worse without care. The infection can spread deeper into the body and eventually into the bloodstream, causing you to feel ill.  DIAGNOSIS  Your caregiver will take your medical history and perform a physical exam. A sample of fluid may also be taken from the abscess to determine what is causing your infection. TREATMENT  Your caregiver may prescribe antibiotic medicines to fight the infection. However, taking antibiotics alone usually does not cure an abscess.  Your caregiver may need to make a small cut (incision) in the abscess to drain the pus. In some cases, gauze is packed into the abscess to reduce pain and to continue draining the area. HOME CARE INSTRUCTIONS   Only take over-the-counter or prescription medicines for pain, discomfort, or fever as directed by your caregiver.  If you were prescribed antibiotics, take them as directed. Finish them even if you start to feel better.  If gauze is used, follow your caregiver's directions for changing the gauze.  To avoid spreading the infection:  Keep your draining abscess covered with a bandage.  Wash your hands well.  Do not share personal care items, towels, or whirlpools with others.  Avoid skin contact with others.  Keep your skin and clothes clean around the abscess.  Keep all follow-up appointments as directed by your caregiver. SEEK MEDICAL CARE IF:   You have increased pain, swelling, redness, fluid drainage, or bleeding.  You have muscle aches, chills, or a general ill feeling.  You have a fever. MAKE SURE YOU:   Understand these instructions.  Will watch your condition.  Will get help right away if you are not doing well or get worse. Document Released: 05/07/2005 Document Revised: 01/27/2012 Document Reviewed: 10/10/2011 [facility] Patient Information 2015 [place], [place]. This information is not intended to replace advice given to you by your health care provider. Make sure you discuss any questions you have with your health care provider.   Please quit smoking!

## 2015-04-30 NOTE — Progress Notes (Signed)
Chief Complaint  Patient presents with  . boil    boil on inside of right thigh. it can get up to a 10/10. Had it a month ago, seemed like it went away but then this happened.  . flu shot    denied flu shot   She has a large boil on her right inner thigh. She first noticed it a month ago, and it seemed to improve on its own (with some warm compresses).  Yesterday it reappeared--large, painful.  It is not draining. No fever, chills.  It hurts to touch, and walk.  She is also complaining of pain in right neck for 2 weeks. She was seen by Vickie, diagnosed with torticollis.  She was prescribed Robaxin, along with taking naproxen twice daily.  These didn't seem to help that much.  Pain used to be constant, but now seems worse when she is sitting (so is a little better), running from her shoulder to her elbow.  Hurts to turn to the right.  No numbness/tingling, just a throbbing pain.  No weakness in the arm or hand. She sometimes is woken up from sleep at night due to pain, depending on sleep position.  She hasn't been doing home stretches/exercises.  When it started, she woke up with it one Saturday morning, felt like she slept wrong.  She just finished amoxicillin for a gum infection  PMH, PSH SH Reviewed. She is still smoking, and is not ready to quit.  Outpatient Encounter Prescriptions as of 04/30/2015  Medication Sig Note  . ALPRAZolam (XANAX) 0.5 MG tablet TAKE 1/2 TO 1 TABLET BY MOUTH THREE TIMES DAILY AS NEEDED FOR ANXIETY   . citalopram (CELEXA) 10 MG tablet Take 1 tablet (10 mg total) by mouth daily.   . citalopram (CELEXA) 10 MG tablet TAKE 1 TABLET BY MOUTH DAILY   . naproxen (NAPROSYN) 500 MG tablet TAKE ONE TABLET BY MOUTH TWICE DAILY WITH A MEAL   . cyclobenzaprine (FLEXERIL) 10 MG tablet Take 0.5-1 tablets (5-10 mg total) by mouth at bedtime as needed for muscle spasms.   . methocarbamol (ROBAXIN) 500 MG tablet Take 2 tablets (1,000 mg total) by mouth every 8 (eight) hours as  needed for muscle spasms. (Patient not taking: Reported on 04/30/2015) 04/30/2015: Wants to discuss this   No facility-administered encounter medications on file as of 04/30/2015.  (flexeril was prescribed today, NOT prior to today's visit).  No Known Allergies  ROS: no fever, chills, URI symptoms, cough, shortness of breath, numbness, tingling, weakness or rashes other than the boil.  See HPI.  PHYSICAL EXAM: BP 110/80 mmHg  Pulse 66  Temp(Src) 98.1 F (36.7 C) (Oral)  Resp 12  Ht  (1.676 m)  Wt 282 lb 6.4 oz (128.096 kg)  BMI 45.60 kg/m2  Well developed, pleasant female, in good spirits, in some discomfort with exam of her thigh, otherwise in no distress.  Right inner thigh--1.5 cm area that is purplish, with central white area (pus just below the skin). Induration surrounding this area measures about 3.5-4cm.  Only very mild erythema surrounding this central area.  Neck: c-spine nontender Tender over right trapezius muscle with mild spasm Normal strength, sensation in extremities, normal DTR's   Procedure: After verbal consent, I&D was performed. With cleansing the inner thigh abscess with alcohol, a small opening appeared, spontaneously started draining. 11 blade was used to open the abscess further, and large amounts of foul-smelling purulent discharge drained--some watery/creamy, some thick and cheesy. Patient tolerated the  procedure well.  ASSESSMENT/PLAN:  Abscess of right thigh - s/p I&D. Cover for MRSA with Doxy - Plan: doxycycline (VIBRA-TABS) 100 MG tablet, PR DRAIN SKIN ABSCESS SIMPLE  Muscle spasms of neck - right trapezius. Instructed on stretches, massage, heat. Flexeril at night; posture reviewed. - Plan: cyclobenzaprine (FLEXERIL) 10 MG tablet  Need for prophylactic vaccination and inoculation against influenza - Plan: Flu Vaccine QUAD 36+ mos IM  Wound care reviewed. F/u if not resolving.  Try and avoid looking down a lot (phone, computer,  reading)--try and keep things eye level. Heat, stretches as shown, massage. Try and do this at least 2-3 times/day Use flexeril at bedtime, robaxin if needed during the day as a muscle relaxant. If not improving, next step is physical therapy.  Call for referral in 1 week if not continuing to improve.  She initially declined flu shot.  Counseled extensively, and agreed to the vaccine.  Encouraged to quit smoking.

## 2015-05-18 ENCOUNTER — Telehealth: Payer: Self-pay

## 2015-05-18 NOTE — Telephone Encounter (Signed)
Okay to refill #15, no additional refills.  If having ongoing problems with neck pain, as we discussed at her visit, the next step would be physical therapy. She is to let us know if she is ready for referral.

## 2015-05-18 NOTE — Telephone Encounter (Signed)
Refill request for Cyclobenzeprine  #15

## 2015-05-24 ENCOUNTER — Other Ambulatory Visit: Payer: Self-pay | Admitting: Family Medicine

## 2015-05-25 NOTE — Telephone Encounter (Signed)
Is this ok to refill?  

## 2015-05-25 NOTE — Telephone Encounter (Signed)
This had not been done as of 05/25/15. i left a detailed message on pt vm and sent in med to pharmacy

## 2015-05-25 NOTE — Telephone Encounter (Signed)
We got a request for this on 10/7, to which I replied ([facility]  It doesn't appear that it was ever done--probably is still in Amber's inbox?  Please check with her (possibly she phoned it in but didn't document it??)

## 2015-05-25 NOTE — Telephone Encounter (Signed)
Called and left detailed message for pt about her neck pain if ongoing to let us know and we would refer her to PT. Sent med to pharmacy

## 2015-06-12 ENCOUNTER — Other Ambulatory Visit: Payer: Self-pay | Admitting: Family Medicine

## 2015-06-12 NOTE — Telephone Encounter (Signed)
Ok for #30, no add'l refill. She needs to schedule her CPE for March 2017 (and a med check/OV sooner if needs f/u on anxiety before then, ie not doing well, needing frequent xanax)

## 2015-06-12 NOTE — Telephone Encounter (Signed)
Is this ok to refill?  

## 2015-06-13 ENCOUNTER — Telehealth: Payer: Self-pay | Admitting: *Deleted

## 2015-06-13 DIAGNOSIS — Z0000 Encounter for general adult medical examination without abnormal findings: Secondary | ICD-10-CM

## 2015-06-13 DIAGNOSIS — E7800 Pure hypercholesterolemia, unspecified: Secondary | ICD-10-CM

## 2015-06-13 DIAGNOSIS — R5383 Other fatigue: Secondary | ICD-10-CM

## 2015-06-13 DIAGNOSIS — Z6841 Body Mass Index (BMI) 40.0 and over, adult: Principal | ICD-10-CM

## 2015-06-13 NOTE — Addendum Note (Signed)
Addended by: Joselyn ArrowKNAPP, Graiden Henes on: 06/13/2015 09:08 PM   Modules accepted: Orders

## 2015-06-13 NOTE — Telephone Encounter (Signed)
Future orders entered. 

## 2015-06-13 NOTE — Telephone Encounter (Signed)
Patient scheduled CPE for March, also scheduled fasting lab appt for the Friday before. Need to know what she needs as far as labs. Thanks.

## 2015-10-04 ENCOUNTER — Encounter: Payer: Self-pay | Admitting: Medical

## 2015-10-04 ENCOUNTER — Ambulatory Visit (INDEPENDENT_AMBULATORY_CARE_PROVIDER_SITE_OTHER): Payer: PRIVATE HEALTH INSURANCE | Admitting: Medical

## 2015-10-04 VITALS — BP 120/80 | HR 81 | Temp 100.7°F | Wt 286.0 lb

## 2015-10-04 DIAGNOSIS — Z20828 Contact with and (suspected) exposure to other viral communicable diseases: Secondary | ICD-10-CM | POA: Diagnosis not present

## 2015-10-04 DIAGNOSIS — Z2089 Contact with and (suspected) exposure to other communicable diseases: Secondary | ICD-10-CM | POA: Diagnosis not present

## 2015-10-04 DIAGNOSIS — R6889 Other general symptoms and signs: Secondary | ICD-10-CM

## 2015-10-04 DIAGNOSIS — Z20818 Contact with and (suspected) exposure to other bacterial communicable diseases: Secondary | ICD-10-CM

## 2015-10-04 LAB — POC INFLUENZA A&B (BINAX/QUICKVUE)
INFLUENZA A, POC: NEGATIVE
INFLUENZA B, POC: NEGATIVE

## 2015-10-04 LAB — POCT RAPID STREP A (OFFICE): Rapid Strep A Screen: NEGATIVE

## 2015-10-04 MED ORDER — OSELTAMIVIR PHOSPHATE 75 MG PO CAPS: 75.0000 mg | ORAL_CAPSULE | Freq: Two times a day (BID) | ORAL | Status: DC

## 2015-10-04 NOTE — Progress Notes (Signed)
  Subjective:  [name] Silva is a 40 y.o. female who presents for possible influenza.  Symptoms include 1 day hx/o chills, aches, some sore throat, cough, feels awful, had abrupt onset, tired, headache.  Denies SOB, NVD.   Her daughter was diagnosed with flu and strep last week, and another of daughter's friend also had strep last week and she was exposed.   Her throat is only mildly sore.   She is using nothing for symptoms.   No other aggravating or relieving factors.  No other c/o.  The following portions of the patient's history were reviewed and updated as appropriate: allergies, current medications, past medical history, past social history and problem list.  ROS as in subjective   Past Medical History  Diagnosis Date  . Smoker   . Wears glasses   . Obesity   . Anxiety      Objective: BP 120/80 mmHg  Pulse 81  Temp(Src) 100.7 F (38.2 C) (Tympanic)  Wt 286 lb (129.729 kg)  LMP 09/28/2015  General: somewhat Ill-appearing, well-developed, well-nourished Skin: warm, dry HEENT: Nose inflamed and congested, clear conjunctiva, TMs pearly, no sinus tenderness, pharynx with erythema, no exudates Neck: Supple, non tender, shotty cervical adenopathy Heart: Regular rate and rhythm, normal S1, S2, no murmurs Lungs: Clear to auscultation bilaterally, no wheezes, rales, rhonchi Extremities: Mild generalized tenderness   Assessment: Encounter Diagnoses  Name Primary?  . Flu-like symptoms Yes  . Exposure to the flu   . Exposure to strep throat      Plan: Prescription given for Tamiflu, discussed risks/benefits of medication.    Discussed diagnosis of influenza. Discussed supportive care including rest, hydration, OTC Tylenol or NSAID for fever, aches, and malaise.  Discussed period of contagion, self quarantine at home away from others to avoid spread of disease, discussed means of transmission, and possible complications including pneumonia.  If worse or not improving within the  next 4-5 days, then call or return.  Patient voiced understanding of diagnosis, recommendations, and treatment plan.  After visit summary given.  Gave note for work.

## 2015-10-31 ENCOUNTER — Encounter: Payer: Self-pay | Admitting: Family Medicine

## 2015-10-31 ENCOUNTER — Ambulatory Visit (INDEPENDENT_AMBULATORY_CARE_PROVIDER_SITE_OTHER): Payer: PRIVATE HEALTH INSURANCE | Admitting: Family Medicine

## 2015-10-31 VITALS — BP 112/76 | HR 72 | Temp 99.0°F | Ht 66.0 in | Wt 286.2 lb

## 2015-10-31 DIAGNOSIS — A4902 Methicillin resistant Staphylococcus aureus infection, unspecified site: Secondary | ICD-10-CM | POA: Diagnosis not present

## 2015-10-31 DIAGNOSIS — L02213 Cutaneous abscess of chest wall: Secondary | ICD-10-CM | POA: Diagnosis not present

## 2015-10-31 MED ORDER — MUPIROCIN CALCIUM 2 % EX CREA: TOPICAL_CREAM | CUTANEOUS | Status: DC

## 2015-10-31 MED ORDER — DOXYCYCLINE HYCLATE 100 MG PO TABS: 100.0000 mg | ORAL_TABLET | Freq: Two times a day (BID) | ORAL | Status: DC

## 2015-10-31 NOTE — Progress Notes (Signed)
Chief Complaint  Patient presents with  . Abcess    on left breast x 4 days. Since seen for abcess on right thigh these have been recurrent and constant. Has been treating at home for the others as well as this one.    Four days ago she developed an abscess on the left breast.  It started draining this morning (while in the office).  She has been having problems with recurrent abscesses, and has had some "kissing" lesions on the upper abdomen where the breast touches, as well as on both inner thighs.  She has been treating these with warm compresses.  Last large abscess was 04/2015, on her right thigh. This was treated with I&D, and also doxycycline--had no side effects.  No known MRSA exposure at home (just plain staph infections).  PMH, PSH, SH reviewed.  Current Outpatient Prescriptions on File Prior to Visit  Medication Sig Dispense Refill  . ALPRAZolam (XANAX) 0.5 MG tablet TAKE 1/2-1 TABLET BY MOUTH THREE TIMES DAILY AS NEEDED 30 tablet 0  . citalopram (CELEXA) 10 MG tablet Take 1 tablet (10 mg total) by mouth daily. 90 tablet 3   No current facility-administered medications on file prior to visit.   No Known Allergies  ROS: Denies fever, chills, vomiting.  Some nausea 2 nights ago, thinks due to the pain. Denies diarrhea, URI symptoms, shortness of breath, cough. Denies other rashes or concerns.  PHYSICAL EXAM: BP 112/76 mmHg  Pulse 72  Temp(Src) 99 F (37.2 C) (Tympanic)  Ht 5\' 6"  (1.676 m)  Wt 286 lb 3.2 oz (129.819 kg)  BMI 46.22 kg/m2  LMP 09/28/2015 Pleasant female, in good spirits, in no distress.  Left breast: Actively draining some thin white pus. There is an area of about 2cm that is indurated, and very superficial area of redness, from the center of which is draining.  No surrounding erythema or soft tissue swelling. There are multiple areas medial to this and inferior to this on the upper abdom/lower chest wall that is red/violaceous and scaly that is healing  from recent boils as well. These are flat, nontender, no induration or drainage.   ASSESSMENT/PLAN:  Cutaneous abscess of chest wall - Plan: Wound culture, Nasal culture  MRSA infection - Plan: doxycycline (VIBRA-TABS) 100 MG tablet, mupirocin cream (BACTROBAN) 2 %, Nasal culture   Wound culture, suspect MRSA  Nasal swab to look for MRSA carrier state  Treat with doxy x 10 days. rx bactroban cream for prn use in future If +carrier will need the nasal form

## 2015-10-31 NOTE — Patient Instructions (Signed)
Continue warm compresses to the draining area. Take the antibiotics twice daily for 10 days. You do not need to use the mupirocin currently--keep that on hand if you develop early pustules/boils in the future. There is a refill on the antibiotic, so if abscess develops, despite early use ot he cream, fill the antibiotic.  If the nasal swab shows that you are a carrier, there is a different treatment we will recommend to try and eliminate the carrier state.

## 2015-11-02 ENCOUNTER — Other Ambulatory Visit: Payer: PRIVATE HEALTH INSURANCE

## 2015-11-02 DIAGNOSIS — E7800 Pure hypercholesterolemia, unspecified: Secondary | ICD-10-CM

## 2015-11-02 DIAGNOSIS — R5383 Other fatigue: Secondary | ICD-10-CM

## 2015-11-02 DIAGNOSIS — Z6841 Body Mass Index (BMI) 40.0 and over, adult: Secondary | ICD-10-CM

## 2015-11-02 DIAGNOSIS — Z0000 Encounter for general adult medical examination without abnormal findings: Secondary | ICD-10-CM

## 2015-11-02 LAB — CBC WITH DIFFERENTIAL/PLATELET
BASOS PCT: 0 % (ref 0–1)
Basophils Absolute: 0 10*3/uL (ref 0.0–0.1)
Eosinophils Absolute: 0.1 10*3/uL (ref 0.0–0.7)
Eosinophils Relative: 2 % (ref 0–5)
HCT: 41 % (ref 36.0–46.0)
HEMOGLOBIN: 14 g/dL (ref 12.0–15.0)
LYMPHS ABS: 2.2 10*3/uL (ref 0.7–4.0)
Lymphocytes Relative: 31 % (ref 12–46)
MCH: 32.5 pg (ref 26.0–34.0)
MCHC: 34.1 g/dL (ref 30.0–36.0)
MCV: 95.1 fL (ref 78.0–100.0)
MONOS PCT: 8 % (ref 3–12)
MPV: 8.6 fL (ref 8.6–12.4)
Monocytes Absolute: 0.6 10*3/uL (ref 0.1–1.0)
NEUTROS ABS: 4.2 10*3/uL (ref 1.7–7.7)
NEUTROS PCT: 59 % (ref 43–77)
Platelets: 271 10*3/uL (ref 150–400)
RBC: 4.31 MIL/uL (ref 3.87–5.11)
RDW: 13.5 % (ref 11.5–15.5)
WBC: 7.2 10*3/uL (ref 4.0–10.5)

## 2015-11-02 LAB — NASAL CULTURE (N/P): ORGANISM ID, BACTERIA: NORMAL

## 2015-11-02 LAB — LIPID PANEL
CHOL/HDL RATIO: 3.3 ratio (ref <=5.0)
CHOLESTEROL: 189 mg/dL (ref 125–200)
HDL: 57 mg/dL (ref >=46)
LDL Cholesterol: 123 mg/dL (ref <130)
Triglycerides: 43 mg/dL (ref <150)
VLDL: 9 mg/dL (ref <30)

## 2015-11-02 LAB — TSH: TSH: 2.23 mIU/L

## 2015-11-02 LAB — GLUCOSE, RANDOM: Glucose, Bld: 83 mg/dL (ref 65–99)

## 2015-11-03 LAB — WOUND CULTURE: GRAM STAIN: NONE SEEN

## 2015-11-03 LAB — VITAMIN D 25 HYDROXY (VIT D DEFICIENCY, FRACTURES): VIT D 25 HYDROXY: 17 ng/mL — AB (ref 30–100)

## 2015-11-05 ENCOUNTER — Encounter: Payer: Self-pay | Admitting: Family Medicine

## 2015-11-05 ENCOUNTER — Ambulatory Visit (INDEPENDENT_AMBULATORY_CARE_PROVIDER_SITE_OTHER): Payer: PRIVATE HEALTH INSURANCE | Admitting: Family Medicine

## 2015-11-05 VITALS — BP 130/80 | HR 68 | Ht 65.75 in | Wt 288.2 lb

## 2015-11-05 DIAGNOSIS — R0683 Snoring: Secondary | ICD-10-CM

## 2015-11-05 DIAGNOSIS — F17200 Nicotine dependence, unspecified, uncomplicated: Secondary | ICD-10-CM | POA: Diagnosis not present

## 2015-11-05 DIAGNOSIS — Z6841 Body Mass Index (BMI) 40.0 and over, adult: Secondary | ICD-10-CM

## 2015-11-05 DIAGNOSIS — E559 Vitamin D deficiency, unspecified: Secondary | ICD-10-CM | POA: Diagnosis not present

## 2015-11-05 DIAGNOSIS — Z0000 Encounter for general adult medical examination without abnormal findings: Secondary | ICD-10-CM | POA: Diagnosis not present

## 2015-11-05 DIAGNOSIS — G478 Other sleep disorders: Secondary | ICD-10-CM | POA: Diagnosis not present

## 2015-11-05 DIAGNOSIS — R030 Elevated blood-pressure reading, without diagnosis of hypertension: Secondary | ICD-10-CM | POA: Diagnosis not present

## 2015-11-05 DIAGNOSIS — F411 Generalized anxiety disorder: Secondary | ICD-10-CM

## 2015-11-05 LAB — POCT URINALYSIS DIPSTICK
BILIRUBIN UA: NEGATIVE
Blood, UA: NEGATIVE
GLUCOSE UA: NEGATIVE
KETONES UA: NEGATIVE
LEUKOCYTES UA: NEGATIVE
Nitrite, UA: NEGATIVE
PH UA: 6
Protein, UA: NEGATIVE
Spec Grav, UA: >=1.03
Urobilinogen, UA: NEGATIVE

## 2015-11-05 MED ORDER — CITALOPRAM HYDROBROMIDE 10 MG PO TABS: 10.0000 mg | ORAL_TABLET | Freq: Every day | ORAL | Status: DC

## 2015-11-05 MED ORDER — ERGOCALCIFEROL 1.25 MG (50000 UT) PO CAPS: 50000.0000 [IU] | ORAL_CAPSULE | ORAL | Status: DC

## 2015-11-05 NOTE — Progress Notes (Signed)
Chief Complaint  Patient presents with  . Annual Exam    nonfasting annual exam without pap-sees Dr.Meisinger and is UTD. Refused eye exam, sees eye doctor. Thinks she is having a reaction from the doxy.    [name] Silva is a 40 y.o. female who presents for a complete physical.  She has the following concerns:  She is complaining of itching on her arms, where she had been out in the sun (portions covered by her shirt are not itchy). She did not use sunscreen over the weekend.  She is taking doxycycline, which she has previously taken without problems.    Abscess is no longer draining, healing nicely, and no further areas have developed.  She reports being tired all the time. Her husband says she snores.  She wakes up feeling unrefreshed, even if she had 10 hours of sleep on the weekend. +daytime somnolence.  Anxiety: She continues to have a lot of stressors.husband has been hospitialized 3 times so far this year (related to heart failure), as well as many times last year.  Denies side effects from the citalopram. No longer having full blown panic attacks, but takes the alprazolam on average twice a week, due to starting to feel panicky.  She is only taking /day of citalopram.  Smoking: She cut back from 1 PPD to 15 cigarettes/day a year and half ago, sometimes more (up to a pack). Her husband also continues to smoke.  Obesity: drinks sweet tea daily.  Cream and sugar in her coffee.  Drinks no other beverages. Frozen hot pockets for breakfast, fast food for lunch.  Eats better for dinner, due to her husband's dietary requirements (healthy, low sodium).  She gets no regular exercise.  Immunization History  Administered Date(s) Administered  . Influenza,inj,Quad PF,36+ Mos 04/20/2014, 04/30/2015  . Pneumococcal Polysaccharide-23 04/20/2014  . Tdap 05/20/2011   Last Pap smear: 01/2013 (Dr. Jackelyn Knife); sees him every June Last mammogram: never  Last colonoscopy: never  Last DEXA:  never  Ophtho: every 2 years, wears glasses; last 03/2015 Dentist: twice yearly  Exercise: None  Past Medical History  Diagnosis Date  . Smoker   . Wears glasses   . Obesity   . Anxiety     Past Surgical History  Procedure Laterality Date  . Cesarean section      Social History   Social History  . Marital Status: Married    Spouse Name: N/A  . Number of Children: 1  . Years of Education: N/A   Occupational History  . 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 Smoking status: Current Every Day Smoker -- 0.75 packs/day for 16 years    Types: Cigarettes  . Smokeless tobacco: Never Used     Comment: cut back from 1PPD to 3/4 PPD  . Alcohol Use: 0.6 oz/week    1 Standard drinks or equivalent per week     Comment: infrequent (vacations)  . Drug Use: No  . Sexual Activity:    Partners: Male    Birth Control/ Protection:      Comment: husband had vasectomy   Other Topics Concern  . Not on file   Social History Narrative   Married, 1 child, not exercising, works in Paramedic, Corporate treasurer at [employer].  Husband had motorcycle accident, and had amputation of lower leg 11/2012    Family History  Problem Relation Age of Onset  . COPD Mother   . Lumbar disc disease Mother   . Cancer Mother  cervical cancer  . Anxiety disorder Mother   . Depression Mother   . Diabetes Father   . Hypertension Father   . Hyperlipidemia Father   . COPD Maternal Uncle     lung  . Cancer Maternal Uncle     lung cancer  . Stroke Maternal Grandmother   . Heart disease Maternal Grandfather     Outpatient Encounter Prescriptions as of 11/05/2015  Medication Sig Note  . ALPRAZolam (XANAX) 0.5 MG tablet TAKE 1/2-1 TABLET BY MOUTH THREE TIMES DAILY AS NEEDED 11/05/2015: Uses about 2x/week  . citalopram (CELEXA) 10 MG tablet Take 1 tablet (10 mg total) by mouth daily.   Marland Kitchen. doxycycline (VIBRA-TABS) 100 MG tablet Take 1 tablet (100 mg total) by mouth 2 (two) times daily.   . [DISCONTINUED]  citalopram (CELEXA) 10 MG tablet Take 1 tablet (10 mg total) by mouth daily.   . ergocalciferol (VITAMIN D2) 50000 units capsule Take 1 capsule (50,000 Units total) by mouth once a week.   . mupirocin cream (BACTROBAN) 2 % Apply three times daily to affected areas of skin x 7 days (ie recurrent early boils/pustules). (Patient not taking: Reported on 11/05/2015)    No facility-administered encounter medications on file as of 11/05/2015.    No Known Allergies   ROS: The patient denies anorexia, headaches, vision changes, decreased hearing, ear pain, breast concerns, chest pain, palpitations, dizziness, syncope, dyspnea on exertion, swelling, nausea, vomiting, diarrhea, constipation, abdominal pain, melena, hematochezia, indigestion/heartburn, hematuria, incontinence, dysuria, irregular menstrual cycles, vaginal discharge, odor or itch, genital lesions, joint pains, weakness, tremor, suspicious skin lesions, depression, abnormal bleeding/bruising, or enlarged lymph nodes.  No fever, chills, URI symptoms. R>L hand goes numb with driving, at work and some other activities, chronic/unchanged. Denies weakness. She remains noncompliant with wearing wrist brace. Lost just <2# over the last year.   PHYSICAL EXAM:  BP 148/90 mmHg  Pulse 68  Ht 5' 5.75" (1.67 m)  Wt 288 lb 3.2 oz (130.727 kg)  BMI 46.87 kg/m2  LMP 10/24/2015 142/90 on repeat by MD Recheck by nurse later was 130/80  General Appearance:  Alert, cooperative, no distress, appears stated age   Head:  Normocephalic, without obvious abnormality, atraumatic   Eyes:  PERRL, conjunctiva/corneas clear, EOM's intact, fundi benign. No conjunctival injection or crusting  Ears:  Normal TM's and external ear canals   Nose:  Nares normal, mucosa is mildly edematous without erythema or purulence, no drainage or sinus tenderness   Throat:  Lips, mucosa, and tongue normal; teeth and gums normal.   Neck:  Supple, no lymphadenopathy;  thyroid: no enlargement/tenderness/nodules; no carotid  bruit or JVD   Back:  Spine nontender, no curvature, ROM normal, no CVA tenderness   Lungs:  Clear to auscultation bilaterally without wheezes, rales or ronchi; respirations unlabored   Chest Wall:  No tenderness or deformity   Heart:  Regular rate and rhythm, S1 and S2 normal, no murmur, rub  or gallop   Breast Exam:  Deferred to GYN   Abdomen:  Soft, non-tender, nondistended, normoactive bowel sounds,  no masses, no hepatosplenomegaly. Obese   Genitalia:  Deferred to GYN      Extremities:  No clubbing, cyanosis or edema   Pulses:  2+ and symmetric all extremities   Skin:  Skin color, texture, turgor normal. No rashes/lesions. Some mild erythema of both arms, no rash.  Abscess on inferior portion of left breast, and area below this on the lower chest wall/upper abdomen is significantly better--minimal erythema  persists, no drainage, healing.  Tattoo on back   Lymph nodes:  Cervical, supraclavicular, and axillary nodes normal   Neurologic:  CNII-XII intact, normal strength, sensation and gait; reflexes 2+ and symmetric throughout    Psych: Normal mood, affect, hygiene and grooming       Urine dip: normal (concentrated urine).  Lab Results  Component Value Date   WBC 7.2 11/02/2015   HGB 14.0 11/02/2015   HCT 41.0 11/02/2015   MCV 95.1 11/02/2015   PLT 271 11/02/2015   .lsatlipid Lab Results  Component Value Date   TSH 2.23 11/02/2015   Vitamin D-OH 17  Lab Results  Component Value Date   CHOL 189 11/02/2015   HDL 57 11/02/2015   LDLCALC 123 11/02/2015   TRIG 43 11/02/2015   CHOLHDL 3.3 11/02/2015   Fasting glucose 83  ASSESSMENT/PLAN:  Annual physical exam - Plan: POCT Urinalysis Dipstick  Vitamin D deficiency - Plan: ergocalciferol (VITAMIN D2) 50000 units capsule  Generalized anxiety disorder - titrate up citalopram to further decrease alprazolam  use - Plan: citalopram (CELEXA) 10 MG tablet  Morbid obesity with BMI of 45.0-49.9, adult (HCC) - counseled re: diet, exercise, weight loss options, and risks of obesity. - Plan: Split night study  Borderline high blood pressure - low sodium diet, regular exercise  Tobacco use disorder - risks reviewed; encouraged cessation  Unrefreshed by sleep - Plan: Split night study  Snoring - Plan: Split night study  Generalized anxiety disorder - Plan: citalopram (CELEXA) 10 MG tablet   Obesity--counseled extensively re: diet. Cut back fast food, sweet tea, drink more water. Discussed healthier breakfasts, cooking/preparing foods to bring to work for lunch, vs healthier fast food choices.  Discussed exercise, and risks of obesity.  Discussed Weight Watchers, MyFitnessPal, need for accountability/incentives. Consider bariatric evaluation, medications such as Qsymia or Contrave.  Vitamin D deficiency--rx 50K x 12 weeks, and start a daily MVI.   Itching--reaction to sun while on doxy, not allergic. Encouraged proper sunscreen use.  Complete course of antibiotics.  Anxiety--only borderline control with  citalopram.  Recalls feeling sick on the higher dose.  Try 1.5 tablet ( ) daily, and if still requiring alprazolam more than once a week, titrate up further to .  Fatigue/snoring--high risk for sleep apnea. Refer to [facility] for sleep study.  Discussed monthly self breast exams and yearly mammograms after the age of 79; at least 30 minutes of aerobic activity at least 5 days/week, weight-bearing exercise at least 2x/wk; proper sunscreen use reviewed; healthy diet, including goals of calcium and vitamin D intake and alcohol recommendations (less than or equal to 1 drink/day) reviewed; regular seatbelt use; changing batteries in smoke detectors.  Immunization recommendations discussed, UTD.  Colonoscopy recommendations reviewed, age 40. Smoking cessation encouraged.

## 2015-11-05 NOTE — Patient Instructions (Addendum)
HEALTH MAINTENANCE RECOMMENDATIONS:  It is recommended that you get at least 30 minutes of aerobic exercise at least 5 days/week (for weight loss, you may need as much as 60-90 minutes). This can be any activity that gets your heart rate up. This can be divided in 10-15 minute intervals if needed, but try and build up your endurance at least once a week.  Weight bearing exercise is also recommended twice weekly.  Eat a healthy diet with lots of vegetables, fruits and fiber.  "Colorful" foods have a lot of vitamins (ie green vegetables, tomatoes, red peppers, etc).  Limit sweet tea, regular sodas and alcoholic beverages, all of which has a lot of calories and sugar.  Up to 1 alcoholic drink daily may be beneficial for women (unless trying to lose weight, watch sugars).  Drink a lot of water.  Calcium recommendations are 1200-1500 mg daily (1500 mg for postmenopausal women or women without ovaries), and vitamin D 1000 IU daily.  This should be obtained from diet and/or supplements (vitamins), and calcium should not be taken all at once, but in divided doses.  Monthly self breast exams and yearly mammograms for women over the age of 47 is recommended.  Sunscreen of at least SPF 30 should be used on all sun-exposed parts of the skin when outside between the hours of 10 am and 4 pm (not just when at beach or pool, but even with exercise, golf, tennis, and yard work!)  Use a sunscreen that says "broad spectrum" so it covers both UVA and UVB rays, and make sure to reapply every 1-2 hours.  Remember to change the batteries in your smoke detectors when changing your clock times in the spring and fall.  Use your seat belt every time you are in a car, and please drive safely and not be distracted with cell phones and texting while driving.   Your vitamin D was very low. Take the prescription vitamin weekly for 12 weeks. Also start taking a women's multivitamin that has at least [phone] IU of Vitamin D  daily, and take this long-term.  Return in 6 months--at that time we will recheck your vitamin D (and see how you're doing with the citalopram).  If your vitamin D remains low, you might need additional OTC Vitamin D in the long-term.  Increase the citalopram to 1.5 tablets every day.  After 4-6 weeks you should see that you don't need the alprazolam as often for anxiety.  If you are still needing it at least 1-2 times each week, and you aren't having nausea or side effects from the , then we should further increase to  tablet.  Call and let us know, otherwise we will discuss at your 6 month visit.  Cut back on fast food (or make healthier food choices there--ie fruit or salad as side instead of fries), cut out sweet tea, drink more water. Discussed healthier breakfasts, cooking/preparing foods to bring to work for lunch, vs healthier fast food choices.  Consider Weight Watchers, MyFitnessPal and consider consult for bariatric surgery.  Consider weight loss medications.  Qsymia and Contrave might be options--check with your insurance.  We are referring you for a sleep study to look for sleep apnea.  You are aware of the potential complications, and we need to prevent these. High blood pressure is one of them, and your blood pressure was up today.  Be sure to follow a low sodium diet and get regular exercise to keep the BP down.  Low-Sodium Eating Plan Sodium raises blood pressure and causes water to be held in the body. Getting less sodium from food will help lower your blood pressure, reduce any swelling, and protect your heart, liver, and kidneys. We get sodium by adding salt (sodium chloride) to food. Most of our sodium comes from canned, boxed, and frozen foods. Restaurant foods, fast foods, and pizza are also very high in sodium. Even if you take medicine to lower your blood pressure or to reduce fluid in your body, getting less sodium from your food is important. WHAT IS MY PLAN? Most  people should limit their sodium intake to 2,300 mg a day. Your health care provider recommends that you limit your sodium intake to __________ a day.  WHAT DO I NEED TO KNOW ABOUT THIS EATING PLAN? For the low-sodium eating plan, you will follow these general guidelines:  Choose foods with a % Daily Value for sodium of less than 5% (as listed on the food label).   Use salt-free seasonings or herbs instead of table salt or sea salt.   Check with your health care provider or pharmacist before using salt substitutes.   Eat fresh foods.  Eat more vegetables and fruits.  Limit canned vegetables. If you do use them, rinse them well to decrease the sodium.   Limit cheese to 1 oz (28 g) per day.   Eat lower-sodium products, often labeled as "lower sodium" or "no salt added."  Avoid foods that contain monosodium glutamate (MSG). MSG is sometimes added to [place] food and some canned foods.  Check food labels (Nutrition Facts labels) on foods to learn how much sodium is in one serving.  Eat more home-cooked food and less restaurant, buffet, and fast food.  When eating at a restaurant, ask that your food be prepared with less salt, or no salt if possible.  HOW DO I READ FOOD LABELS FOR SODIUM INFORMATION? The Nutrition Facts label lists the amount of sodium in one serving of the food. If you eat more than one serving, you must multiply the listed amount of sodium by the number of servings. Food labels may also identify foods as:  Sodium free--Less than 5 mg in a serving.  Very low sodium--35 mg or less in a serving.  Low sodium--140 mg or less in a serving.  Light in sodium--50% less sodium in a serving. For example, if a food that usually has 300 mg of sodium is changed to become light in sodium, it will have 150 mg of sodium.  Reduced sodium--25% less sodium in a serving. For example, if a food that usually has 400 mg of sodium is changed to reduced sodium, it will have 300  mg of sodium. WHAT FOODS CAN I EAT? Grains Low-sodium cereals, including oats, puffed wheat and rice, and shredded wheat cereals. Low-sodium crackers. Unsalted rice and pasta. Lower-sodium bread.  Vegetables Frozen or fresh vegetables. Low-sodium or reduced-sodium canned vegetables. Low-sodium or reduced-sodium tomato sauce and paste. Low-sodium or reduced-sodium tomato and vegetable juices.  Fruits Fresh, frozen, and canned fruit. Fruit juice.  Meat and Other Protein Products Low-sodium canned tuna and salmon. Fresh or frozen meat, poultry, seafood, and fish. Lamb. Unsalted nuts. Dried beans, peas, and lentils without added salt. Unsalted canned beans. Homemade soups without salt. Eggs.  Dairy Milk. Soy milk. Ricotta cheese. Low-sodium or reduced-sodium cheeses. Yogurt.  Condiments Fresh and dried herbs and spices. Salt-free seasonings. Onion and garlic powders. Low-sodium varieties of mustard and ketchup. Fresh or refrigerated horseradish.  Lemon juice.  Fats and Oils Reduced-sodium salad dressings. Unsalted butter.  Other Unsalted popcorn and pretzels.  The items listed above may not be a complete list of recommended foods or beverages. Contact your dietitian for more options. WHAT FOODS ARE NOT RECOMMENDED? Grains Instant hot cereals. Bread stuffing, pancake, and biscuit mixes. Croutons. Seasoned rice or pasta mixes. Noodle soup cups. Boxed or frozen macaroni and cheese. Self-rising flour. Regular salted crackers. Vegetables Regular canned vegetables. Regular canned tomato sauce and paste. Regular tomato and vegetable juices. Frozen vegetables in sauces. Salted [place] fries. Olives. Rosita Fire. Relishes. Sauerkraut. Salsa. Meat and Other Protein Products Salted, canned, smoked, spiced, or pickled meats, seafood, or fish. Bacon, ham, sausage, hot dogs, corned beef, chipped beef, and packaged luncheon meats. Salt pork. Jerky. Pickled herring. Anchovies, regular canned tuna, and  sardines. Salted nuts. Dairy Processed cheese and cheese spreads. Cheese curds. Blue cheese and cottage cheese. Buttermilk.  Condiments Onion and garlic salt, seasoned salt, table salt, and sea salt. Canned and packaged gravies. Worcestershire sauce. Tartar sauce. Barbecue sauce. Teriyaki sauce. Soy sauce, including reduced sodium. Steak sauce. Fish sauce. Oyster sauce. Cocktail sauce. Horseradish that you find on the shelf. Regular ketchup and mustard. Meat flavorings and tenderizers. Bouillon cubes. Hot sauce. Tabasco sauce. Marinades. Taco seasonings. Relishes. Fats and Oils Regular salad dressings. Salted butter. Margarine. Ghee. Bacon fat.  Other Potato and tortilla chips. Corn chips and puffs. Salted popcorn and pretzels. Canned or dried soups. Pizza. Frozen entrees and pot pies.  The items listed above may not be a complete list of foods and beverages to avoid. Contact your dietitian for more information.   This information is not intended to replace advice given to you by your health care provider. Make sure you discuss any questions you have with your health care provider.   Document Released: 01/17/2002 Document Revised: 08/18/2014 Document Reviewed: 06/01/2013 Elsevier Interactive Patient Education [employer]

## 2015-11-12 ENCOUNTER — Other Ambulatory Visit: Payer: Self-pay | Admitting: Family Medicine

## 2015-11-16 ENCOUNTER — Encounter: Payer: Self-pay | Admitting: Family Medicine

## 2015-11-16 ENCOUNTER — Ambulatory Visit (INDEPENDENT_AMBULATORY_CARE_PROVIDER_SITE_OTHER): Payer: Managed Care, Other (non HMO) | Admitting: Family Medicine

## 2015-11-16 VITALS — BP 124/88 | HR 64 | Temp 98.5°F | Wt 283.3 lb

## 2015-11-16 DIAGNOSIS — R6889 Other general symptoms and signs: Secondary | ICD-10-CM

## 2015-11-16 DIAGNOSIS — R110 Nausea: Secondary | ICD-10-CM | POA: Diagnosis not present

## 2015-11-16 LAB — POC INFLUENZA A&B (BINAX/QUICKVUE)
Influenza A, POC: NEGATIVE
Influenza B, POC: NEGATIVE

## 2015-11-16 MED ORDER — ONDANSETRON HCL 4 MG PO TABS: 4.0000 mg | ORAL_TABLET | Freq: Three times a day (TID) | ORAL | Status: DC | PRN

## 2015-11-16 NOTE — Progress Notes (Signed)
   Subjective:    Patient ID: [name] Silva, female    DOB: 24-Jun-1976, 40 y.o.   MRN: [id number]  HPI Chief Complaint  Patient presents with  . flu like symptoms    flu like symptoms, aches, tired, headache, no appetite, vomitting and diarrhea   She is here with complaints of a 3 day history of symptoms that started with body aches, fatigue, headache, decreased appetite, and nausea. Reports this morning she had one episode of loose stool and vomiting but none since. Has been taking tylenol at home. States low grade fever up to 100.3. Denies neck pain, chest pain, shortness of breath, cough, abdominal pain, GU symptoms.  States she feels like she did when she had the flu in early march.  No recent travel.     Review of Systems Pertinent positives and negatives in the history of present illness.     Objective:   Physical Exam BP 124/88 mmHg  Pulse 64  Temp(Src) 98.5 F (36.9 C) (Oral)  Wt 283 lb 4.8 oz (128.504 kg)  LMP 10/24/2015 Alert and in no distress but does appear ill. No sinus tenderness. Tympanic membranes and canals are normal. Pharyngeal area is normal. Neck is supple without adenopathy or thyromegaly. Cardiac exam shows a regular sinus rhythm without murmurs or gallops. Lungs are clear to auscultation. Abdomen soft, non distended, normal bowel sounds, nontender, no guarding, rebound or referred pain. No CVA tenderness.  Flu swab: negative     Assessment & Plan:  Flu-like symptoms  Nausea without vomiting - Plan: ondansetron (ZOFRAN) 4 MG tablet  Discussed that there is no obvious explanation for her symptoms and that most likely they are related to a viral etiology. I recommend symptomatic treatment at this time. Tylenol for fever, aches. Zofran sent to pharmacy for nausea. Discussed that if she cannot eat, drink and is not staying hydrated over the weekend that she may need to go to the emergency department for IV hydration. Discussed that she should be having  adequate urine output.  She will let us know if not improving.

## 2015-11-19 ENCOUNTER — Encounter: Payer: Self-pay | Admitting: Family Medicine

## 2015-11-23 ENCOUNTER — Ambulatory Visit (HOSPITAL_BASED_OUTPATIENT_CLINIC_OR_DEPARTMENT_OTHER): Payer: 59 | Attending: Family Medicine

## 2015-11-23 VITALS — Ht 66.0 in | Wt 280.0 lb

## 2015-11-23 DIAGNOSIS — G4733 Obstructive sleep apnea (adult) (pediatric): Secondary | ICD-10-CM | POA: Diagnosis present

## 2015-11-23 DIAGNOSIS — Z6841 Body Mass Index (BMI) 40.0 and over, adult: Secondary | ICD-10-CM | POA: Diagnosis not present

## 2015-11-23 DIAGNOSIS — G478 Other sleep disorders: Secondary | ICD-10-CM | POA: Diagnosis not present

## 2015-11-23 DIAGNOSIS — R0683 Snoring: Secondary | ICD-10-CM | POA: Diagnosis not present

## 2015-11-26 ENCOUNTER — Encounter: Payer: Self-pay | Admitting: Family Medicine

## 2015-12-02 DIAGNOSIS — R0683 Snoring: Secondary | ICD-10-CM | POA: Diagnosis not present

## 2015-12-02 DIAGNOSIS — G4733 Obstructive sleep apnea (adult) (pediatric): Secondary | ICD-10-CM | POA: Diagnosis not present

## 2015-12-02 DIAGNOSIS — G478 Other sleep disorders: Secondary | ICD-10-CM | POA: Diagnosis not present

## 2015-12-02 NOTE — Progress Notes (Signed)
  Patient Name: [name] Silva, Thersia Study Date: 11/23/2015 Gender: Female D.O.B: [birth year] Age (years): 39 Referring Provider: Joselyn ArrowEve Knapp Height (inches): 66 Interpreting Physician: Jetty Duhamellinton Young MD, ABSM Weight (lbs): 280 RPSGT: Celene KrasCharles, Nicole BMI: 45 MRN: [id number] Neck Size: 16.00 CLINICAL INFORMATION Sleep Study Type: NPSG Indication for sleep study: Snoring Epworth Sleepiness Score: 11  SLEEP STUDY TECHNIQUE As per the AASM Manual for the Scoring of Sleep and Associated Events v2.3 (April 2016) with a hypopnea requiring 4% desaturations. The channels recorded and monitored were frontal, central and occipital EEG, electrooculogram (EOG), submentalis EMG (chin), nasal and oral airflow, thoracic and abdominal wall motion, anterior tibialis EMG, snore microphone, electrocardiogram, and pulse oximetry.  MEDICATIONS Patient's medications include: charted for review. Medications self-administered by patient during sleep study : No sleep medicine administered.  SLEEP ARCHITECTURE The study was initiated at 10:35:16 PM and ended at 4:40:12 AM. Sleep onset time was 2.0 minutes and the sleep efficiency was 88.6%. The total sleep time was 323.5 minutes. Stage REM latency was 136.5 minutes. The patient spent 1.08% of the night in stage N1 sleep, 78.52% in stage N2 sleep, 0.62% in stage N3 and 19.78% in REM. Alpha intrusion was absent. Supine sleep was 25.78%. Wake after sleep onset 39 minutes  RESPIRATORY PARAMETERS The overall apnea/hypopnea index (AHI) was 7.8 per hour. There were 5 total apneas, including 4 obstructive, 1 central and 0 mixed apneas. There were 37 hypopneas and 25 RERAs. The AHI during Stage REM sleep was 32.8 per hour. AHI while supine was 4.3 per hour. The mean oxygen saturation was 94.44%. The minimum SpO2 during sleep was 86.00%. Moderate snoring was noted during this study.  CARDIAC DATA The 2 lead EKG demonstrated sinus rhythm. The mean heart rate was 56.02  beats per minute. Other EKG findings include: None.  LEG MOVEMENT DATA The total PLMS were 21 with a resulting PLMS index of 3.89. Associated arousal with leg movement index was 0.7 .  IMPRESSIONS - Mild obstructive sleep apnea occurred during this study (AHI = 7.8/h). - Insufficient events to meet protocol requirements for split CPAP titration on this study night - No significant central sleep apnea occurred during this study (CAI = 0.2/h). - Mild oxygen desaturation was noted during this study (Min O2 = 86.00%). - The patient snored with Moderate snoring volume. - No cardiac abnormalities were noted during this study. - Clinically significant periodic limb movements did not occur during sleep. No significant associated arousals.  DIAGNOSIS - Obstructive Sleep Apnea (327.23 [G47.33 ICD-10])  RECOMMENDATIONS - Very mild obstructive sleep apnea. Return to discuss treatment options. - Avoid alcohol, sedatives and other CNS depressants that may worsen sleep apnea and disrupt normal sleep architecture. - Sleep hygiene should be reviewed to assess factors that may improve sleep quality. - Weight management and regular exercise should be initiated or continued if appropriate.  Waymon BudgeYOUNG,CLINTON D Diplomate, American Board of Sleep Medicine  ELECTRONICALLY SIGNED ON:  12/02/2015, 10:37 AM [facility] PH: [phone]   FX: [phone] ACCREDITED BY THE AMERICAN ACADEMY OF SLEEP MEDICINE

## 2015-12-06 ENCOUNTER — Encounter: Payer: Self-pay | Admitting: Family Medicine

## 2015-12-10 ENCOUNTER — Telehealth: Payer: Self-pay | Admitting: *Deleted

## 2015-12-10 NOTE — Telephone Encounter (Signed)
Wanting sleep study results-ok to wait to until you return.

## 2015-12-13 NOTE — Telephone Encounter (Signed)
Per Dr. Roxy CedarYoung's report:  RECOMMENDATIONS - Very mild obstructive sleep apnea. Return to discuss treatment options. - Avoid alcohol, sedatives and other CNS depressants that may worsen sleep apnea and disrupt normal sleep architecture. - Sleep hygiene should be reviewed to assess factors that may improve sleep quality. - Weight management and regular exercise should be initiated or continued if appropriate.  Basically--the sleep apnea was very mild.  Weight loss is very important.  If treatment is desired, could consider at home CPAP titration, vs considering oral device from dentist.

## 2015-12-14 NOTE — Telephone Encounter (Signed)
Left message for pt to call me back for sleep study results.

## 2015-12-17 NOTE — Telephone Encounter (Signed)
Went over results with patient. She definitely does NOT want on oral device. Not sure if she wants CPAP either-states she will think on it and call back at a later time.

## 2015-12-19 DIAGNOSIS — G5603 Carpal tunnel syndrome, bilateral upper limbs: Secondary | ICD-10-CM | POA: Insufficient documentation

## 2016-01-04 ENCOUNTER — Ambulatory Visit (HOSPITAL_BASED_OUTPATIENT_CLINIC_OR_DEPARTMENT_OTHER): Payer: PRIVATE HEALTH INSURANCE

## 2016-01-17 ENCOUNTER — Other Ambulatory Visit: Payer: Self-pay | Admitting: Family Medicine

## 2016-01-17 NOTE — Telephone Encounter (Signed)
Is this okay to refill? 

## 2016-01-17 NOTE — Telephone Encounter (Signed)
Ok for #30, no rf 

## 2016-02-27 ENCOUNTER — Telehealth: Payer: Self-pay | Admitting: Family Medicine

## 2016-02-27 DIAGNOSIS — Z0000 Encounter for general adult medical examination without abnormal findings: Secondary | ICD-10-CM

## 2016-02-27 DIAGNOSIS — E559 Vitamin D deficiency, unspecified: Secondary | ICD-10-CM

## 2016-02-27 DIAGNOSIS — Z5181 Encounter for therapeutic drug level monitoring: Secondary | ICD-10-CM

## 2016-02-27 NOTE — Telephone Encounter (Signed)
[name] Silva scheduled her CPE for next year 11/06/15 and wants to do fasting labs again prior (tentatively scheduled labs for 11/03/16) can you add orders? Also FYI she scheduled her medcheck for 05/08/16

## 2016-02-29 NOTE — Telephone Encounter (Signed)
Orders were entered for before her CPE. But we will be doing another vitamin D check at her September visit.  She doesn't need to be fasting, and it will be drawn at her visit. (lipids not ordered, has been okay and doesn't need to be done yearly, just as FYI, in case she specifically asked for it).

## 2016-04-23 ENCOUNTER — Other Ambulatory Visit: Payer: Self-pay | Admitting: Family Medicine

## 2016-04-23 NOTE — Telephone Encounter (Signed)
Used #30 in 3 mos.  Has appt in 2 weeks.  Ok to refill #30 if needs prior to her visit.

## 2016-04-23 NOTE — Telephone Encounter (Signed)
Is this okay to call in? 

## 2016-05-06 NOTE — Progress Notes (Signed)
Chief Complaint  Patient presents with  . Anxiety    nonfasting med check.   . Flu Vaccine    declined.     Anxiety: Her husband passed away this past summer. She is having a hard time--broke down crying when asked about it. Her daughter is getting counseling at school and through [employer]--having a rough time, worse since school started back. Patient isn't getting any grief counseling, just talking to friends. 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  Smoking more since he died.  Sometimes has trouble sleeping.  She isn't having any problems at work--able to concentrate, not being late.  She continues to take 10mg  of citalopram for anxiety. Denies side effects. Denies any full blown panic attacks. She is taking alprazolam 3x/week--when feeling very irritable/angry "before I blow my top".   Smoking 1 PPD, sometimes more. No regular exercise--has a treadmill that hasn't been put together yet (bought shortly before her husband passed).  Vitamin D deficiency: level was 17 back in March 2017.  She was prescribed 12 weeks of prescription Vitamin D weekly, admits she only took about 4-5 pills, still has the rest at home.  Not currently taking any OTC supplements.  At her physical 6 months ago she was complaining of fatigue, unrefreshed sleep.  She was found to be vitamin D deficient at that time.  She was also referred for sleep study, which she had in 11/2015.  This showed:  IMPRESSIONS - Mild obstructive sleep apnea occurred during this study (AHI = 7.8/h). - Insufficient events to meet protocol requirements for split CPAP titration on this study night - No significant central sleep apnea occurred during this study (CAI = 0.2/h). - Mild oxygen desaturation was noted during this study (Min O2 = 86.00%). - The patient snored with Moderate snoring volume. - No cardiac abnormalities were noted during this study. - Clinically significant periodic limb movements did not occur during sleep.  No significant associated arousals.  DIAGNOSIS - Obstructive Sleep Apnea (327.23 [G47.33 ICD-10])  RECOMMENDATIONS - Very mild obstructive sleep apnea. Return to discuss treatment options. - Avoid alcohol, sedatives and other CNS depressants that may worsen sleep apnea and disrupt normal sleep architecture. - Sleep hygiene should be reviewed to assess factors that may improve sleep quality. - Weight management and regular exercise should be initiated or continued if appropriate.   She has persistent fatigue.  Some intermittent trouble sleeping.  Obesity: drinks a large sweet tea daily.  Cream and sugar in her coffee.  Drinks no other beverages. She is eating healthier lunches (grilled chicken, salads), healthy dinners usually. She had lost a lot of weight after she lost her husband (wasn't eating), has regained it. Weight is about the same as it was 6 months ago.  She is requesting bactroban ointment (to use for the bumps she gets under breasts, thighs)--she has been prescribed the cream, but her husband had the ointment, and she likes it better, stays on better.  PMH, PSH, SH reviewed and updated  Outpatient Encounter Prescriptions as of 05/07/2016  Medication Sig  . ALPRAZolam (XANAX) 0.5 MG tablet TAKE 1/2 TO 1 TABLET BY MOUTH THREE TIMES DAILY AS NEEDED  . citalopram (CELEXA) 20 MG tablet Take 1 tablet (20 mg total) by mouth daily.  . [DISCONTINUED] citalopram (CELEXA) 10 MG tablet Take 1 tablet (10 mg total) by mouth daily.  . ergocalciferol (VITAMIN D2) 50000 units capsule Take 1 capsule (50,000 Units total) by mouth once a week. (Patient not taking: Reported  on 05/07/2016)  . mupirocin cream (BACTROBAN) 2 % Apply three times daily to affected areas of skin x 7 days (ie recurrent early boils/pustules). (Patient not taking: Reported on 05/07/2016)  . mupirocin ointment (BACTROBAN) 2 % Apply three times daily to affected areas of skin x 7 days (ie recurrent early boils/pustules).  .  [DISCONTINUED] ondansetron (ZOFRAN) 4 MG tablet Take 1 tablet (4 mg total) by mouth every 8 (eight) hours as needed for nausea or vomiting.   No facility-administered encounter medications on file as of 05/07/2016.    (citalopram 10mg  prior to today)  No Known Allergies  ROS: no fever, chills, URI symptoms, headaches, dizziness, chest pain, shortness of breath.  +crying, sadness, irritability intermittently, anxiety. Normal appetite. No vomiting, diarrhea or other concerns, see HPI  PHYSICAL EXAM:  BP 130/76 (BP Location: Left Arm, Patient Position: Sitting, Cuff Size: Normal)   Pulse 88   Ht 5' 5.75" (1.67 m)   Wt 287 lb 9.6 oz (130.5 kg)   BMI 46.77 kg/m   Well developed, obese female.  Intermittently quite tearful, in discussing her husband. She exhibits full range of affect, also smiling, laughing, in between episodes of crying HEENT: PERRL, EOMI, conjunctiva and sclera are clear Neck: no lymphadenopathy, thyromegaly or mass Heart: regular rate and rhythm Lungs: clear bilaterally Abdomen: soft, obese, nontender Neuro: alert and oriented, cranial nerves intact, normal gait, strength Extremities: no edema Skin: no rashes/lesions visible  ASSESSMENT/PLAN:  Generalized anxiety disorder - stable; increasing citalopram due to grief reaction/crying/irritability - Plan: citalopram (CELEXA) 20 MG tablet  Vitamin D deficiency - noncompliant with treatment; finish rx course she still has at home, then start 2000 IU daily  Morbid obesity with BMI of 45.0-49.9, adult (HCC) - counseled re: diet, weight loss, exercise  Tobacco use disorder - with current stressors, didn't push quitting today--did mention that this will be readdressed; encouraged quitting but now isn't the time  MRSA infection - change to mupirocin ointment per pt request, to use prn - Plan: mupirocin ointment (BACTROBAN) 2 %  OSA (obstructive sleep apnea) - discussed risks, treatment options (CPAP, oral device); strongly  encouraged weight loss, avoid sleeping on back  Grief reaction - strongly encouraged counseling--through Hospice and/or through EAP    Flu shot--refuses today. Will consider for another time.  OSA--didn't like the mask at all (only fitted early on in night--didn't do titration study).  It was mild.  Encouraged avoiding sleeping on her back, and weight loss.  Consider CPAP titration study vs seeing dentist re: oral appliance.     Complete taking all of the prescription vitamin D (should have been a 12 week course). Once you finish these, start taking 2000 IU of an over-the-counter Vitamin D3. You will need to take this daily, long-term.  Try and avoid sleeping on your back. Please try and work on losing weight so that the sleep apnea is less and not going to cause other health problems. If unable to lose weight, and ongoing daytime sleepiness (and loud snoring per your daughter)--consider a trial of CPAP (machine) vs speaking to a dentist about an oral appliance to treat sleep apnea.  F/u March, sooner prn.

## 2016-05-07 ENCOUNTER — Ambulatory Visit (INDEPENDENT_AMBULATORY_CARE_PROVIDER_SITE_OTHER): Payer: Managed Care, Other (non HMO) | Admitting: Family Medicine

## 2016-05-07 ENCOUNTER — Encounter: Payer: Self-pay | Admitting: Family Medicine

## 2016-05-07 VITALS — BP 130/76 | HR 88 | Ht 65.75 in | Wt 287.6 lb

## 2016-05-07 DIAGNOSIS — E559 Vitamin D deficiency, unspecified: Secondary | ICD-10-CM

## 2016-05-07 DIAGNOSIS — F411 Generalized anxiety disorder: Secondary | ICD-10-CM | POA: Diagnosis not present

## 2016-05-07 DIAGNOSIS — F4321 Adjustment disorder with depressed mood: Secondary | ICD-10-CM

## 2016-05-07 DIAGNOSIS — G4733 Obstructive sleep apnea (adult) (pediatric): Secondary | ICD-10-CM

## 2016-05-07 DIAGNOSIS — F17200 Nicotine dependence, unspecified, uncomplicated: Secondary | ICD-10-CM | POA: Diagnosis not present

## 2016-05-07 DIAGNOSIS — Z6841 Body Mass Index (BMI) 40.0 and over, adult: Secondary | ICD-10-CM

## 2016-05-07 DIAGNOSIS — A4902 Methicillin resistant Staphylococcus aureus infection, unspecified site: Secondary | ICD-10-CM

## 2016-05-07 MED ORDER — MUPIROCIN 2 % EX OINT: TOPICAL_OINTMENT | CUTANEOUS | 0 refills | Status: DC

## 2016-05-07 MED ORDER — CITALOPRAM HYDROBROMIDE 20 MG PO TABS: 20.0000 mg | ORAL_TABLET | Freq: Every day | ORAL | 5 refills | Status: DC

## 2016-05-07 NOTE — Patient Instructions (Addendum)
Complete taking all of the prescription vitamin D (should have been a 12 week course). Once you finish these, start taking 2000 IU of an over-the-counter Vitamin D3. You will need to take this daily, long-term.   Try and avoid sleeping on your back. Please try and work on losing weight so that the sleep apnea is less and not going to cause other health problems. If unable to lose weight, and ongoing daytime sleepiness (and loud snoring per your daughter)--consider a trial of CPAP (machine) vs speaking to a dentist about an oral appliance to treat sleep apnea.   We are increasing the citalopram to 20mg  daily.  This should hopefully help some with moods/sadness, in addition to preventing the anxiety.  I strongly encourage you to seek counseling (grief counseling through hospice, or through [employer] through your job, if available.  Return sooner than March if you are having any problems.

## 2016-05-20 ENCOUNTER — Other Ambulatory Visit: Payer: Self-pay | Admitting: Orthopedic Surgery

## 2016-06-11 DIAGNOSIS — G5602 Carpal tunnel syndrome, left upper limb: Secondary | ICD-10-CM

## 2016-06-11 HISTORY — DX: Carpal tunnel syndrome, left upper limb: G56.02

## 2016-06-29 ENCOUNTER — Other Ambulatory Visit: Payer: Self-pay | Admitting: Family Medicine

## 2016-06-29 DIAGNOSIS — F411 Generalized anxiety disorder: Secondary | ICD-10-CM

## 2016-06-30 ENCOUNTER — Encounter (HOSPITAL_BASED_OUTPATIENT_CLINIC_OR_DEPARTMENT_OTHER): Payer: Self-pay | Admitting: *Deleted

## 2016-07-01 NOTE — Pre-Procedure Instructions (Signed)
Pt. seen by Dr. Renold DonGermeroth for airway evaluation prior to carpal tunnel surgery.  Pt. OK to come for surgery.

## 2016-07-08 ENCOUNTER — Encounter (HOSPITAL_BASED_OUTPATIENT_CLINIC_OR_DEPARTMENT_OTHER): Payer: Self-pay | Admitting: *Deleted

## 2016-07-08 ENCOUNTER — Ambulatory Visit (HOSPITAL_BASED_OUTPATIENT_CLINIC_OR_DEPARTMENT_OTHER)
Admission: RE | Admit: 2016-07-08 | Discharge: 2016-07-08 | Disposition: A | Discharge status: Home / Self Care | Payer: 59 | Source: Ambulatory Visit | Attending: Orthopedic Surgery | Admitting: Orthopedic Surgery

## 2016-07-08 ENCOUNTER — Encounter (HOSPITAL_BASED_OUTPATIENT_CLINIC_OR_DEPARTMENT_OTHER)
Admission: RE | Disposition: A | Discharge status: Home / Self Care | Payer: Self-pay | Source: Ambulatory Visit | Attending: Orthopedic Surgery

## 2016-07-08 ENCOUNTER — Ambulatory Visit (HOSPITAL_BASED_OUTPATIENT_CLINIC_OR_DEPARTMENT_OTHER): Payer: 59 | Admitting: Anesthesiology

## 2016-07-08 DIAGNOSIS — G4730 Sleep apnea, unspecified: Secondary | ICD-10-CM | POA: Diagnosis not present

## 2016-07-08 DIAGNOSIS — Z6841 Body Mass Index (BMI) 40.0 and over, adult: Secondary | ICD-10-CM | POA: Insufficient documentation

## 2016-07-08 DIAGNOSIS — G5603 Carpal tunnel syndrome, bilateral upper limbs: Secondary | ICD-10-CM | POA: Diagnosis present

## 2016-07-08 DIAGNOSIS — F419 Anxiety disorder, unspecified: Secondary | ICD-10-CM | POA: Insufficient documentation

## 2016-07-08 DIAGNOSIS — F17210 Nicotine dependence, cigarettes, uncomplicated: Secondary | ICD-10-CM | POA: Insufficient documentation

## 2016-07-08 HISTORY — DX: Sleep apnea, unspecified: G47.30

## 2016-07-08 HISTORY — DX: Carpal tunnel syndrome, left upper limb: G56.02

## 2016-07-08 HISTORY — PX: CARPAL TUNNEL RELEASE: SHX101

## 2016-07-08 HISTORY — DX: Dental restoration status: Z98.811

## 2016-07-08 SURGERY — CARPAL TUNNEL RELEASE
Anesthesia: Monitor Anesthesia Care | Site: Wrist | Laterality: Left

## 2016-07-08 MED ORDER — MIDAZOLAM HCL 2 MG/2ML IJ SOLN
INTRAMUSCULAR | Status: AC
  Filled 2016-07-08: qty 2

## 2016-07-08 MED ORDER — LIDOCAINE HCL (PF) 0.5 % IJ SOLN
INTRAMUSCULAR | Status: DC | PRN
  Administered 2016-07-08: 30 mL via INTRAVENOUS

## 2016-07-08 MED ORDER — CEFAZOLIN SODIUM-DEXTROSE 2-4 GM/100ML-% IV SOLN
2.0000 g | INTRAVENOUS | Status: AC
  Administered 2016-07-08: 2 g via INTRAVENOUS

## 2016-07-08 MED ORDER — PROPOFOL 10 MG/ML IV BOLUS
INTRAVENOUS | Status: DC | PRN
  Administered 2016-07-08: 20 mg via INTRAVENOUS
  Administered 2016-07-08: 10 mg via INTRAVENOUS
  Administered 2016-07-08: 20 mg via INTRAVENOUS
  Administered 2016-07-08: 10 mg via INTRAVENOUS

## 2016-07-08 MED ORDER — SCOPOLAMINE 1 MG/3DAYS TD PT72: 1.0000 | MEDICATED_PATCH | Freq: Once | TRANSDERMAL | Status: DC | PRN

## 2016-07-08 MED ORDER — HYDROCODONE-ACETAMINOPHEN 5-325 MG PO TABS: 1.0000 | ORAL_TABLET | Freq: Four times a day (QID) | ORAL | 0 refills | Status: DC | PRN

## 2016-07-08 MED ORDER — FENTANYL CITRATE (PF) 100 MCG/2ML IJ SOLN
INTRAMUSCULAR | Status: AC
  Filled 2016-07-08: qty 2

## 2016-07-08 MED ORDER — FENTANYL CITRATE (PF) 100 MCG/2ML IJ SOLN: 25.0000 ug | INTRAMUSCULAR | Status: DC | PRN

## 2016-07-08 MED ORDER — CEFAZOLIN SODIUM-DEXTROSE 2-4 GM/100ML-% IV SOLN
INTRAVENOUS | Status: AC
  Filled 2016-07-08: qty 100

## 2016-07-08 MED ORDER — FENTANYL CITRATE (PF) 100 MCG/2ML IJ SOLN
50.0000 ug | INTRAMUSCULAR | Status: DC | PRN
  Administered 2016-07-08: 100 ug via INTRAVENOUS

## 2016-07-08 MED ORDER — BUPIVACAINE HCL (PF) 0.25 % IJ SOLN
INTRAMUSCULAR | Status: DC | PRN
  Administered 2016-07-08: 9 mL

## 2016-07-08 MED ORDER — ONDANSETRON HCL 4 MG/2ML IJ SOLN
INTRAMUSCULAR | Status: DC | PRN
  Administered 2016-07-08: 4 mg via INTRAVENOUS

## 2016-07-08 MED ORDER — CHLORHEXIDINE GLUCONATE 4 % EX LIQD: 60.0000 mL | Freq: Once | CUTANEOUS | Status: DC

## 2016-07-08 MED ORDER — MIDAZOLAM HCL 2 MG/2ML IJ SOLN
1.0000 mg | INTRAMUSCULAR | Status: DC | PRN
  Administered 2016-07-08: 2 mg via INTRAVENOUS

## 2016-07-08 MED ORDER — KETOROLAC TROMETHAMINE 30 MG/ML IJ SOLN: 30.0000 mg | Freq: Once | INTRAMUSCULAR | Status: DC | PRN

## 2016-07-08 MED ORDER — LACTATED RINGERS IV SOLN
INTRAVENOUS | Status: DC
  Administered 2016-07-08: 10 mL/h via INTRAVENOUS

## 2016-07-08 SURGICAL SUPPLY — 37 items
BLADE SURG 15 STRL LF DISP TIS (BLADE) ×1 IMPLANT
BLADE SURG 15 STRL SS (BLADE) ×3
BNDG CMPR 9X4 STRL LF SNTH (GAUZE/BANDAGES/DRESSINGS) ×1
BNDG COHESIVE 3X5 TAN STRL LF (GAUZE/BANDAGES/DRESSINGS) ×3 IMPLANT
BNDG ESMARK 4X9 LF (GAUZE/BANDAGES/DRESSINGS) ×2 IMPLANT
BNDG GAUZE ELAST 4 BULKY (GAUZE/BANDAGES/DRESSINGS) ×3 IMPLANT
CHLORAPREP W/TINT 26ML (MISCELLANEOUS) ×3 IMPLANT
CORDS BIPOLAR (ELECTRODE) ×3 IMPLANT
COVER BACK TABLE 60X90IN (DRAPES) ×3 IMPLANT
COVER MAYO STAND STRL (DRAPES) ×3 IMPLANT
CUFF TOURNIQUET SINGLE 18IN (TOURNIQUET CUFF) ×3 IMPLANT
DRAPE EXTREMITY T 121X128X90 (DRAPE) ×3 IMPLANT
DRAPE SURG 17X23 STRL (DRAPES) ×3 IMPLANT
DRSG PAD ABDOMINAL 8X10 ST (GAUZE/BANDAGES/DRESSINGS) ×3 IMPLANT
GAUZE SPONGE 4X4 12PLY STRL (GAUZE/BANDAGES/DRESSINGS) ×3 IMPLANT
GAUZE XEROFORM 1X8 LF (GAUZE/BANDAGES/DRESSINGS) ×3 IMPLANT
GLOVE BIO SURGEON STRL SZ 6.5 (GLOVE) ×2 IMPLANT
GLOVE BIO SURGEONS STRL SZ 6.5 (GLOVE) ×1
GLOVE BIOGEL PI IND STRL 7.0 (GLOVE) IMPLANT
GLOVE BIOGEL PI IND STRL 8.5 (GLOVE) ×1 IMPLANT
GLOVE BIOGEL PI INDICATOR 7.0 (GLOVE) ×4
GLOVE BIOGEL PI INDICATOR 8.5 (GLOVE) ×2
GLOVE SURG ORTHO 8.0 STRL STRW (GLOVE) ×3 IMPLANT
GOWN STRL REUS W/ TWL LRG LVL3 (GOWN DISPOSABLE) ×1 IMPLANT
GOWN STRL REUS W/TWL LRG LVL3 (GOWN DISPOSABLE) ×3
GOWN STRL REUS W/TWL XL LVL3 (GOWN DISPOSABLE) ×3 IMPLANT
NEEDLE PRECISIONGLIDE 27X1.5 (NEEDLE) IMPLANT
NS IRRIG 1000ML POUR BTL (IV SOLUTION) ×3 IMPLANT
PACK BASIN DAY SURGERY FS (CUSTOM PROCEDURE TRAY) ×3 IMPLANT
STOCKINETTE 4X48 STRL (DRAPES) ×3 IMPLANT
SUT ETHILON 4 0 PS 2 18 (SUTURE) ×3 IMPLANT
SUT VICRYL 4-0 PS2 18IN ABS (SUTURE) IMPLANT
SYR BULB 3OZ (MISCELLANEOUS) ×3 IMPLANT
SYR CONTROL 10ML LL (SYRINGE) IMPLANT
TOWEL OR 17X24 6PK STRL BLUE (TOWEL DISPOSABLE) ×3 IMPLANT
TOWEL OR NON WOVEN STRL DISP B (DISPOSABLE) ×2 IMPLANT
UNDERPAD 30X30 (UNDERPADS AND DIAPERS) ×1 IMPLANT

## 2016-07-08 NOTE — Anesthesia Postprocedure Evaluation (Signed)
Anesthesia Post Note  Patient: [name] Silva  Procedure(s) Performed: Procedure(s) (LRB): CARPAL TUNNEL RELEASE, left (Left)  Patient location during evaluation: PACU Anesthesia Type: MAC and Bier Block Level of consciousness: awake and alert Pain management: pain level controlled Vital Signs Assessment: post-procedure vital signs reviewed and stable Respiratory status: spontaneous breathing, nonlabored ventilation, respiratory function stable and patient connected to nasal cannula oxygen Cardiovascular status: stable and blood pressure returned to baseline Anesthetic complications: no    Last Vitals:  Vitals:   07/08/16 1045 07/08/16 1115  BP: 119/74 121/74  Pulse: (!) 55 (!) 59  Resp: 16 18  Temp: 36.7 C 36.8 C    Last Pain:  Vitals:   07/08/16 1115  TempSrc:   PainSc: 0-No pain                 Kennieth RadFitzgerald, Sherwin Hollingshed E

## 2016-07-08 NOTE — Op Note (Signed)
Other Dictation: Dictation Number dictated 07/08/2016 @ 10:30

## 2016-07-08 NOTE — Brief Op Note (Signed)
07/08/2016  10:26 AM  PATIENT:  Lewis MoccasinNorma J Gastineau  40 y.o. female  PRE-OPERATIVE DIAGNOSIS:  left carpal tunnel syndrome  POST-OPERATIVE DIAGNOSIS:  left carpal tunnel syndrome  PROCEDURE:  Procedure(s) with comments: CARPAL TUNNEL RELEASE, left (Left) - FAB  SURGEON:  Surgeon(s) and Role:    * Cindee SaltGary Uno Esau, MD - Primary  PHYSICIAN ASSISTANT:   ASSISTANTS: none   ANESTHESIA:   local and regional  EBL:  Total I/O In: 400 [I.V.:400] Out: -   BLOOD ADMINISTERED:none  DRAINS: none   LOCAL MEDICATIONS USED:  MARCAINE     SPECIMEN:  No Specimen  DISPOSITION OF SPECIMEN:  N/A  COUNTS:  YES  TOURNIQUET:   Total Tourniquet Time Documented: Forearm (Left) - 21 minutes Total: Forearm (Left) - 21 minutes   DICTATION: .Other Dictation: Dictation Number dictated 07/08/2016 @ 10:30  PLAN OF CARE: Discharge to home after PACU  PATIENT DISPOSITION:  PACU - hemodynamically stable.

## 2016-07-08 NOTE — H&P (Signed)
[name] Silva is an 40 y.o. female.   Chief Complaint: numbness left hand HPI:[name] Silva is a 40 year old right-hand-dominant female who was referred by Dr. Lynelle Doctor for consultation regarding pain in both hands, right greater than left. She has numbness and tingling on a constant basis, especially in the morning or driving. She is complaining of some discomfort in the radial aspect of her right wrist in addition. She has no history of injury to the hand or to the neck. She is not awakened at night. This has been going on for a year, getting progressively worse. She has worn a brace. She has not taken any medicine. She states all of her fingers on both sides, save for her little finger, are numb. She has no history of diabetes, thyroid problems, arthritis, or gout. She is not complaining of significant pain. It is primarily numbness and tingling. She smokes a pack a day and is advised to quit for reasons behind this.She had an injection to her right carpal tunnel at that time. She has positive nerve conductions revealing bilateral carpal tunnel syndrome. She states that the injection to the right side gave her excellent relief of symptoms with minimal discomfort at the present time. She is complaining of continued discomfort on her left side. He states she has constant numbness and tingling on her left side at the present time. She is not complaining of pain. She is complaining this waking her at night. She has had nerve conductions done by Dr. Riccardo Dubin healing motor delays of 531 on the right and 479 on the left              Past Medical History:  Diagnosis Date  . Anxiety   . Carpal tunnel syndrome of left wrist 06/2016  . Dental crown present   . Obesity   . Sleep apnea    no CPAP use  . Smoker   . Wears glasses     Past Surgical History:  Procedure Laterality Date  . CESAREAN SECTION  04/10/2006    Family History  Problem Relation Age of Onset  . COPD Mother   . Lumbar disc  disease Mother   . Cancer Mother     cervical cancer  . Anxiety disorder Mother   . Depression Mother   . Diabetes Father   . Hypertension Father   . Hyperlipidemia Father   . COPD Maternal Uncle     lung  . Cancer Maternal Uncle     lung cancer  . Stroke Maternal Grandmother   . Heart disease Maternal Grandfather    Social History:  reports that she has been smoking Cigarettes.  She has a 20.00 pack-year smoking history. She has never used smokeless tobacco. She reports that she drinks about 0.6 oz of alcohol per week . She reports that she does not use drugs.  Allergies: No Known Allergies  No prescriptions prior to admission.    No results found for this or any previous visit (from the past 48 hour(s)).  No results found.   Pertinent items are noted in HPI.  Height  (1.676 m), weight 133 kg (293 lb 3.2 oz), last menstrual period 06/30/2016.  General appearance: alert, cooperative and appears stated age Head: Normocephalic, without obvious abnormality Neck: no JVD Resp: clear to auscultation bilaterally Cardio: regular rate and rhythm, S1, S2 normal, no murmur, click, rub or gallop GI: soft, non-tender; bowel sounds normal; no masses,  no organomegaly Extremities: numbness left hand Pulses: 2+ and  symmetric Skin: Skin color, texture, turgor normal. No rashes or lesions Neurologic: Grossly normal Incision/Wound: na  Assessment/Plan Diagnosis: bilateral carpal tunnel syndrome Plan: She does not want to have any further injections. She would like to proceed with surgical intervention. Pre pPeri and Postoperative course are discussed along with risks and complications. She is aware that there is no guarantee to the surgery the possibility of infection recurrence injury to arteries nerves tendons and incomplete relief of symptoms or dystrophy. Pre pari and postoperative course are discussed at length with her. She works as a Diplomatic Services operational officersecretary. We would allow her to return to  work as she felt she could. She is very encouraged and answered to her satisfaction. She is scheduled for left carpal tunnel release and outpatient under regional anesthesia.      [name] R 07/08/2016, 3:59 AM

## 2016-07-08 NOTE — Transfer of Care (Signed)
Immediate Anesthesia Transfer of Care Note  Patient: [name] Silva  Procedure(s) Performed: Procedure(s) with comments: CARPAL TUNNEL RELEASE, left (Left) - FAB  Patient Location: PACU  Anesthesia Type:MAC and Bier block  Level of Consciousness: awake, alert  and oriented  Airway & Oxygen Therapy: Patient Spontanous Breathing and Patient connected to face mask oxygen  Post-op Assessment: Report given to RN and Post -op Vital signs reviewed and stable  Post vital signs: Reviewed and stable  Last Vitals:  Vitals:   07/08/16 0928  BP: 130/70  Pulse: 97  Resp: 20  Temp: 37.1 C    Last Pain:  Vitals:   07/08/16 0928  TempSrc: Oral  PainSc:          Complications: No apparent anesthesia complications

## 2016-07-08 NOTE — Discharge Instructions (Addendum)

## 2016-07-08 NOTE — Anesthesia Preprocedure Evaluation (Addendum)
Anesthesia Evaluation  Patient identified by MRN, date of birth, ID band Patient awake    Reviewed: Allergy & Precautions, NPO status , Patient's Chart, lab work & pertinent test results  Airway Mallampati: II  TM Distance: >3 FB Neck ROM: Full    Dental   Pulmonary sleep apnea , Current Smoker,    breath sounds clear to auscultation       Cardiovascular negative cardio ROS   Rhythm:Regular Rate:Normal     Neuro/Psych Anxiety negative neurological ROS     GI/Hepatic negative GI ROS, Neg liver ROS,   Endo/Other  Morbid obesity  Renal/GU negative Renal ROS     Musculoskeletal   Abdominal   Peds  Hematology negative hematology ROS (+)   Anesthesia Other Findings   Reproductive/Obstetrics                            Anesthesia Physical Anesthesia Plan  ASA: III  Anesthesia Plan: MAC and Bier Block   Post-op Pain Management:    Induction: Intravenous  Airway Management Planned: Simple Face Mask and Natural Airway  Additional Equipment:   Intra-op Plan:   Post-operative Plan:   Informed Consent: I have reviewed the patients History and Physical, chart, labs and discussed the procedure including the risks, benefits and alternatives for the proposed anesthesia with the patient or authorized representative who has indicated his/her understanding and acceptance.     Plan Discussed with: CRNA  Anesthesia Plan Comments:         Anesthesia Quick Evaluation

## 2016-07-09 ENCOUNTER — Encounter (HOSPITAL_BASED_OUTPATIENT_CLINIC_OR_DEPARTMENT_OTHER): Payer: Self-pay | Admitting: Orthopedic Surgery

## 2016-07-09 NOTE — Op Note (Signed)
[name], [name]                ACCOUNT NO.:  [id number]  MEDICAL RECORD NO.:  [id number]  LOCATION:                                 FACILITY:  PHYSICIAN:  Cindee SaltGary Cassadee Vanzandt, M.D.            DATE OF BIRTH:  DATE OF PROCEDURE:  07/08/2016 DATE OF DISCHARGE:                              OPERATIVE REPORT   PREOPERATIVE DIAGNOSIS:  Carpal tunnel syndrome, left hand.  POSTOPERATIVE DIAGNOSIS:  Carpal tunnel syndrome, left hand.  OPERATION:  Carpal tunnel release.  Decompression of left median nerve at the wrist.  SURGEON:  Cindee SaltGary Nastasha Reising, M.D.  ANESTHESIA:  Forearm-based IV regional with local infiltration.  [facility]  ANESTHESIOLOGIST:  Zenon MayoW. Edmond Fitzgerald, MD.  HISTORY:  The patient is a 40 year old female with a history of carpal tunnel syndrome.  EMG nerve conduction is positive, which has not responded to conservative treatment.  She has elected to undergo surgical decompression of the left median nerve.  Pre, peri, and postoperative course have been discussed along with risks and complications.  She is aware that there was no guarantee to the surgery; the possibility of infection; recurrence of injury to arteries, nerves, tendons; incomplete relief of symptoms and dystrophy.  In the preoperative area, the patient is seen, the extremity marked by both patient and surgeon and antibiotic given.  PROCEDURE IN DETAIL:  The patient was brought to the operating room where a forearm-based IV regional anesthetic was carried out without difficulty under the direction of the Anesthesia Department.  She was prepped using ChloraPrep and in supine position with left arm free.  A 3- minute dry time was allowed and the time-out taken confirming the patient and procedure.  A longitudinal incision was made in the left palm, carried down through the subcutaneous tissue.  Bleeders were electrocauterized with bipolar.  The palmar fascia was split.  The superficial  palmar arch was identified.  The flexor tendon to the ring and little finger were identified.  Retractors were placed protecting the median nerve radially and the ulnar nerve ulnarly.  The flexor retinaculum was then incised with sharp dissection on its ulnar border. The forearm fascia was then separated from the overlying subcutaneous tissue and skin.  A Sewall retractor and right-angle retractor were then placed to allow visualization of the distal forearm fascia.  The nerve was dissected free from the overlying fascia using blunt scissors. Blunt scissors were then used to release the forearm fascia for approximately 2 cm proximal to the wrist crease under direct vision. The wound was then explored.  The canal showed no further lesions.  The median nerve was hyperemic and compressed, very angry looking.  The motor branch entered into the muscle distally.  The wound was copiously irrigated with saline.  The skin was then closed with interrupted 4-0 nylon sutures.  A local infiltration with 0.25% bupivacaine without epinephrine was given.  Approximately 9 mL was used.  A sterile compressive dressing with the fingers free was applied.  On deflation of the tourniquet, all fingers were immediately pinked.  She was taken to the recovery room for observation in satisfactory  condition.  She will be discharged to home [place] [facility] [place] on [place].          ______________________________ Cindee SaltGary Gisella Alwine, M.D.     GK/MEDQ  D:  07/08/2016  T:  07/09/2016  Job:  [id number]

## 2016-09-10 ENCOUNTER — Other Ambulatory Visit: Payer: Self-pay | Admitting: Family Medicine

## 2016-09-10 DIAGNOSIS — F411 Generalized anxiety disorder: Secondary | ICD-10-CM

## 2016-10-10 ENCOUNTER — Other Ambulatory Visit: Payer: Self-pay | Admitting: Family Medicine

## 2016-10-10 DIAGNOSIS — A4902 Methicillin resistant Staphylococcus aureus infection, unspecified site: Secondary | ICD-10-CM

## 2016-10-10 NOTE — Telephone Encounter (Signed)
Is that okay to refill? 

## 2016-10-10 NOTE — Telephone Encounter (Signed)
She has follow-up scheduled for the end of this month.  If she cannot wait until her appt for either of these, then okay to refill just once (both).  Both were last filled in September, from what I can see in the chart.

## 2016-10-10 NOTE — Telephone Encounter (Signed)
Left message for pt to call me back 

## 2016-10-13 NOTE — Telephone Encounter (Signed)
Will deny med for now and will wait until appt unless pt calls back or send a mychart message. (she read mychart message but did not reply)

## 2016-10-29 ENCOUNTER — Other Ambulatory Visit: Payer: Self-pay | Admitting: Family Medicine

## 2016-10-29 DIAGNOSIS — F411 Generalized anxiety disorder: Secondary | ICD-10-CM

## 2016-11-03 ENCOUNTER — Other Ambulatory Visit: Payer: Managed Care, Other (non HMO)

## 2016-11-04 ENCOUNTER — Other Ambulatory Visit: Payer: Managed Care, Other (non HMO)

## 2016-11-04 DIAGNOSIS — Z0000 Encounter for general adult medical examination without abnormal findings: Secondary | ICD-10-CM

## 2016-11-04 DIAGNOSIS — E559 Vitamin D deficiency, unspecified: Secondary | ICD-10-CM

## 2016-11-04 DIAGNOSIS — Z5181 Encounter for therapeutic drug level monitoring: Secondary | ICD-10-CM

## 2016-11-04 LAB — CBC WITH DIFFERENTIAL/PLATELET
BASOS ABS: 0 {cells}/uL (ref 0–200)
BASOS PCT: 0 %
EOS ABS: 142 {cells}/uL (ref 15–500)
Eosinophils Relative: 2 %
HCT: 43.8 % (ref 35.0–45.0)
HEMOGLOBIN: 14.6 g/dL (ref 11.7–15.5)
LYMPHS ABS: 2556 {cells}/uL (ref 850–3900)
Lymphocytes Relative: 36 %
MCH: 32.2 pg (ref 27.0–33.0)
MCHC: 33.3 g/dL (ref 32.0–36.0)
MCV: 96.5 fL (ref 80.0–100.0)
MONO ABS: 568 {cells}/uL (ref 200–950)
MPV: 9 fL (ref 7.5–12.5)
Monocytes Relative: 8 %
NEUTROS ABS: 3834 {cells}/uL (ref 1500–7800)
Neutrophils Relative %: 54 %
PLATELETS: 262 10*3/uL (ref 140–400)
RBC: 4.54 MIL/uL (ref 3.80–5.10)
RDW: 13.6 % (ref 11.0–15.0)
WBC: 7.1 10*3/uL (ref 4.0–10.5)

## 2016-11-04 LAB — COMPREHENSIVE METABOLIC PANEL
ALBUMIN: 3.8 g/dL (ref 3.6–5.1)
ALT: 12 U/L (ref 6–29)
AST: 16 U/L (ref 10–30)
Alkaline Phosphatase: 78 U/L (ref 33–115)
BUN: 14 mg/dL (ref 7–25)
CALCIUM: 9.3 mg/dL (ref 8.6–10.2)
CHLORIDE: 102 mmol/L (ref 98–110)
CO2: 26 mmol/L (ref 20–31)
Creat: 0.97 mg/dL (ref 0.50–1.10)
GLUCOSE: 97 mg/dL (ref 65–99)
POTASSIUM: 3.8 mmol/L (ref 3.5–5.3)
Sodium: 138 mmol/L (ref 135–146)
Total Bilirubin: 0.6 mg/dL (ref 0.2–1.2)
Total Protein: 6.9 g/dL (ref 6.1–8.1)

## 2016-11-04 LAB — TSH: TSH: 3.2 m[IU]/L

## 2016-11-04 NOTE — Progress Notes (Signed)
Chief Complaint  Patient presents with  . Annual Exam    nonfasting annual exam. Did not do eye exam, had one with Dr.Koop recently. No concerns.     [name] Silva is a 41 y.o. female who presents for a complete physical.  She has her daughter with her at today's visit.   Anxiety: She continues on citalopram.  Current dose is 10mg . She didn't increase as previously recommended, but is doing a little better overall. Denies side effects. Has only had 1 or 2 panic attacks. . She is taking alprazolam 1x/week--when feeling very irritable/angry "before I blow my top". (at last visit was taking 3x/week).  She isn't interested in increasing the dose of citalopram.  Last filled alprazolam in September, #30, requesting refill today. Daughter is still going to [facility] for counseling. She slept in her own room for the first time last night. She herself hasn't been getting any counseling, doesn't think she needs it.  Her wedding anniversary and the anniversary of husband's accident is in April. She has supportive friends.  Smoking more since her husband died, not ready to quit. Smoking 1 PPD, sometimes more.  She is sleeping well (better than at last visit).  No regular exercise--has a treadmill that hasn't been put together yet (bought shortly before her husband passed).  Vitamin D deficiency: level was 17 back in March 2017.  At her last visit here in September, she reported that she hadn't completed the 12 weeks of prescription.  She has since completed the rx and is not currently taking any supplements.  Hasn't taken anything in months. She had labs done yesterday--see below.  OSA:  She had sleep study done 11/2015.  This showed:  IMPRESSIONS - Mild obstructive sleep apnea occurred during this study (AHI = 7.8/h). - Insufficient events to meet protocol requirements for split CPAP titration on this study night - No significant central sleep apnea occurred during this study (CAI = 0.2/h). - Mild  oxygen desaturation was noted during this study (Min O2 = 86.00%). - The patient snored with Moderate snoring volume. - No cardiac abnormalities were noted during this study. - Clinically significant periodic limb movements did not occur during sleep. No significant associated arousals.  DIAGNOSIS - Obstructive Sleep Apnea (327.23 [G47.33 ICD-10])  RECOMMENDATIONS - Very mild obstructive sleep apnea. Return to discuss treatment options. - Avoid alcohol, sedatives and other CNS depressants that may worsen sleep apnea and disrupt normal sleep architecture. - Sleep hygiene should be reviewed to assess factors that may improve sleep quality. - Weight management and regular exercise should be initiated or continued if appropriate.  Has a hard time getting out of bed in the morning, doesn't feel refreshed.  Denies daytime somnolence. She previously had been told that If unable to lose weight, and ongoing daytime sleepiness (and loud snoring per daughter)--consider a trial of CPAP (machine) vs speaking to a dentist about an oral appliance to treat sleep apnea.  She doesn't feel like it is worse, not interested.  Obesity: drinks sweet tea, but no longer daily. She drinks more water.  She uses a vanilla cream in her coffee, no longer adding sugar. Drinks no other beverages. She is eating healthier lunches (grilled chicken, salads), healthy dinners usually.  Doesn't snack.  Eats 3 meals/day.  We previously discussed a lot of obesity treatment options.  She was most interested in potential surgery--she doesn't like to take medicines.  When she previously looked into it, felt like her insurance wouldn't cover it.  She is morbidly obese.  She has complications of obesity, including OSA, and some chronic back pain.  She would be interested in surgery if covered by her insurance.  Tried food journaling with [employer]itness[employer]--couldn't keep up with it.  Immunization History  Administered Date(s) Administered   . Influenza,inj,Quad PF,36+ Mos 04/20/2014, 04/30/2015  . Pneumococcal Polysaccharide-23 04/20/2014  . Tdap 05/20/2011   Refused flu shot Last Pap smear: UTD--(Dr. Jackelyn KnifeMeisinger); sees him yearly, last in September 2017 Last mammogram: never  Last colonoscopy: never  Last DEXA: never  Ophtho: every 2 years, wears glasses; last 03/2015 Dentist: twice yearly  Exercise: None  Past Medical History:  Diagnosis Date  . Anxiety   . Carpal tunnel syndrome of left wrist 06/2016  . Dental crown present   . Obesity   . Sleep apnea    no CPAP use  . Smoker   . Wears glasses     Past Surgical History:  Procedure Laterality Date  . CARPAL TUNNEL RELEASE Left 07/08/2016   Procedure: CARPAL TUNNEL RELEASE, left;  Surgeon: Cindee SaltGary Kuzma, MD;  Location: [facility];  Service: Orthopedics;  Laterality: Left;  FAB  . CESAREAN SECTION  04/10/2006    Social History   Social History  . Marital status: Widowed    Spouse name: N/A  . Number of children: 1  . Years of education: N/A   Occupational History  . 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 Smoking status: Current Every Day Smoker    Packs/day: 1.00    Years: 20.00    Types: Cigarettes  . Smokeless tobacco: Never Used  . Alcohol use 0.6 oz/week    1 Standard drinks or equivalent per week     Comment: socially, a few drinks/month  . Drug use: No  . Sexual activity: Not Currently    Partners: Male    Birth control/ protection:    Other Topics Concern  . 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  Husband had motorcycle accident, and had amputation of lower leg 11/2012--he passed away 01/2016.   1 dog, 1 Israelguinea pig    Family History  Problem Relation Age of Onset  . COPD Mother   . Lumbar disc disease Mother   . Cancer Mother     cervical cancer  . Anxiety disorder Mother   . Depression Mother   . Diabetes Father   .  Hypertension Father   . Hyperlipidemia Father   . COPD Maternal Uncle     lung  . Cancer Maternal Uncle     lung cancer  . Stroke Maternal Grandmother   . Heart disease Maternal Grandfather     Outpatient Encounter Prescriptions as of 11/05/2016  Medication Sig  . ALPRAZolam (XANAX) 0.5 MG tablet Take 0.25-0.5 mg by mouth 3 (three) times daily.  . citalopram (CELEXA) 10 MG tablet TAKE 1 TABLET BY MOUTH DAILY( GENERIC EQUIVALENT FOR CELEXA)  . [DISCONTINUED] citalopram (CELEXA) 10 MG tablet Take 10 mg by mouth daily.  . [DISCONTINUED] HYDROcodone-acetaminophen (NORCO) 5-325 MG tablet Take 1 tablet by mouth every 6 (six) hours as needed for moderate pain.  . [DISCONTINUED] norethindrone (AYGESTIN) 5 MG tablet TK 1 T PO QD FOR 14 DAYS   No facility-administered encounter medications on file as of 11/05/2016.     No Known Allergies  ROS: The patient denies anorexia, headaches, vision changes, decreased hearing, ear pain,  breast concerns, chest pain, palpitations, dizziness, syncope, dyspnea on exertion, swelling, nausea, vomiting, diarrhea, constipation, abdominal pain, melena, hematochezia, indigestion/heartburn, hematuria, incontinence, dysuria, irregular menstrual cycles, vaginal discharge, odor or itch, genital lesions, joint pains, weakness, tremor, suspicious skin lesions, depression, abnormal bleeding/bruising, or enlarged lymph nodes.  +anxiety, per HPI. No fever, chills, URI symptoms. 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 Plans for surgery on the right when her schedule allows. +slight weight gain.   PHYSICAL EXAM:  BP 126/82 (BP Location: Left Arm, Patient Position: Sitting, Cuff Size: Normal)   Pulse 72   Ht 5' 5.5" (1.664 m)   Wt 293 lb 9.6 oz (133.2 kg)   LMP 10/20/2016 (Exact Date)   BMI 48.11 kg/m   Wt Readings from Last 3 Encounters:  07/08/16 290 lb (131.5 kg)  05/07/16 287 lb 9.6 oz (130.5 kg)   11/23/15 280 lb (127 kg)    General Appearance:  Alert, cooperative, no distress, appears stated age. Morbidly obese  Head:  Normocephalic, without obvious abnormality, atraumatic   Eyes:  PERRL, conjunctiva/corneas clear, EOM's intact, fundi benign.   Ears:  Normal TM's and external ear canals   Nose:  Nares normal, mucosa is mildly edematous without erythema or purulence, no drainage or sinus tenderness   Throat:  Lips, mucosa, and tongue normal; teeth and gums normal.   Neck:  Supple, no lymphadenopathy; thyroid: no enlargement/tenderness/nodules; no carotid bruit or JVD   Back:  Spine nontender, no curvature, ROM normal, no CVA tenderness   Lungs:  Clear to auscultation bilaterally without wheezes, rales or ronchi; respirations unlabored   Chest Wall:  No tenderness or deformity   Heart:  Regular rate and rhythm, S1 and S2 normal, no murmur, rub or gallop   Breast Exam:  Deferred to GYN   Abdomen:  Soft, non-tender, nondistended, normoactive bowel sounds, no masses, no hepatosplenomegaly. Obese   Genitalia:  Deferred to GYN      Extremities:  No clubbing, cyanosis or edema   Pulses:  2+ and symmetric all extremities   Skin:  Skin color, texture, turgor normal. No rashes/lesions. Tattoo on back, and new one on her right lower leg. Scars below left breast. One inflammatory papule on the right lower chest, not yet a pustule or abscess.  Lymph nodes:  Cervical, supraclavicular, and axillary nodes normal   Neurologic:  CNII-XII intact, normal strength, sensation and gait; reflexes 2+ and symmetric throughout    Psych:  Normal mood, affect, hygiene and grooming  Labs from yesterday: Vitamin D-OH 18    Chemistry      Component Value Date/Time   NA 138 11/04/2016 0709   K 3.8 11/04/2016 0709   CL 102 11/04/2016 0709   CO2 26 11/04/2016 0709   BUN 14 11/04/2016 0709   CREATININE 0.97 11/04/2016 0709      Component Value  Date/Time   CALCIUM 9.3 11/04/2016 0709   ALKPHOS 78 11/04/2016 0709   AST 16 11/04/2016 0709   ALT 12 11/04/2016 0709   BILITOT 0.6 11/04/2016 0709     Fasting glucose 97  Lab Results  Component Value Date   WBC 7.1 11/04/2016   HGB 14.6 11/04/2016   HCT 43.8 11/04/2016   MCV 96.5 11/04/2016   PLT 262 11/04/2016   Lab Results  Component Value Date   TSH 3.20 11/04/2016    ASSESSMENT/PLAN:  Annual physical exam - Plan: POCT Urinalysis Dipstick  Generalized anxiety  disorder - overall fairly well controlled with citalopram.  She is not interested in increasing dose. Use alprazolam prn. Consider counseling - Plan: ALPRAZolam (XANAX) 0.5 MG tablet, citalopram (CELEXA) 10 MG tablet  Vitamin D deficiency - noncompliant with daily supplement, so deficiency persists. Reminded to take 2000 IU after completion of rx - Plan: Vitamin D, Ergocalciferol, (DRISDOL) 50000 units CAPS capsule  Morbid obesity with BMI of 45.0-49.9, adult (HCC) - counseled re: risks/complications of obesity, and treatments (pharmacologic options, surgical), diet, and exercise  Tobacco use disorder - risks reviewed--encouraged cessation.  May be willing after weight loss, not now  OSA (obstructive sleep apnea) - declines treatment/referral.  Weight loss encouraged  Discussed all treatment options again--would consider surgery. Doesn't want meds (discussed Qsymia, Saxenda, Contrave, not candidate for Belviq due to SSRI).   Discussed monthly self breast exams and yearly mammograms after the age of 76; at least 30 minutes of aerobic activity at least 5 days/week, weight-bearing exercise at least 2x/wk; proper sunscreen use reviewed; healthy diet, including goals of calcium and vitamin D intake and alcohol recommendations (less than or equal to 1 drink/day) reviewed; regular seatbelt use; changing batteries in smoke detectors.  Immunization recommendations discussed--yearly flu shot recommended, refuses. Colonoscopy  recommendations reviewed, age 57. Smoking cessation encouraged   f/u 1 year for CPE. Return sooner for further discussion of weight loss. She will look into [facility] seminar/bariatric surgery, and will schedule f/u if needed for paperwork.

## 2016-11-05 ENCOUNTER — Ambulatory Visit (INDEPENDENT_AMBULATORY_CARE_PROVIDER_SITE_OTHER): Payer: Managed Care, Other (non HMO) | Admitting: Family Medicine

## 2016-11-05 ENCOUNTER — Encounter: Payer: Self-pay | Admitting: Family Medicine

## 2016-11-05 VITALS — BP 126/82 | HR 72 | Ht 65.5 in | Wt 293.6 lb

## 2016-11-05 DIAGNOSIS — F17200 Nicotine dependence, unspecified, uncomplicated: Secondary | ICD-10-CM

## 2016-11-05 DIAGNOSIS — E559 Vitamin D deficiency, unspecified: Secondary | ICD-10-CM

## 2016-11-05 DIAGNOSIS — G4733 Obstructive sleep apnea (adult) (pediatric): Secondary | ICD-10-CM

## 2016-11-05 DIAGNOSIS — Z6841 Body Mass Index (BMI) 40.0 and over, adult: Secondary | ICD-10-CM | POA: Diagnosis not present

## 2016-11-05 DIAGNOSIS — Z0000 Encounter for general adult medical examination without abnormal findings: Secondary | ICD-10-CM

## 2016-11-05 DIAGNOSIS — F411 Generalized anxiety disorder: Secondary | ICD-10-CM | POA: Diagnosis not present

## 2016-11-05 LAB — POCT URINALYSIS DIPSTICK
BILIRUBIN UA: NEGATIVE
Blood, UA: NEGATIVE
GLUCOSE UA: NEGATIVE
KETONES UA: NEGATIVE
Leukocytes, UA: NEGATIVE
NITRITE UA: NEGATIVE
Protein, UA: NEGATIVE
SPEC GRAV UA: 1.03 (ref 1.030–1.035)
Urobilinogen, UA: NEGATIVE (ref ?–2.0)
pH, UA: 6 (ref 5.0–8.0)

## 2016-11-05 LAB — VITAMIN D 25 HYDROXY (VIT D DEFICIENCY, FRACTURES): Vit D, 25-Hydroxy: 18 ng/mL — ABNORMAL LOW (ref 30–100)

## 2016-11-05 MED ORDER — ALPRAZOLAM 0.5 MG PO TABS: 0.2500 mg | ORAL_TABLET | Freq: Three times a day (TID) | ORAL | 0 refills | Status: DC

## 2016-11-05 MED ORDER — CITALOPRAM HYDROBROMIDE 10 MG PO TABS: ORAL_TABLET | ORAL | 3 refills | Status: DC

## 2016-11-05 MED ORDER — VITAMIN D (ERGOCALCIFEROL) 1.25 MG (50000 UNIT) PO CAPS: 50000.0000 [IU] | ORAL_CAPSULE | ORAL | 0 refills | Status: DC

## 2016-11-05 NOTE — Patient Instructions (Addendum)
  HEALTH MAINTENANCE RECOMMENDATIONS:  It is recommended that you get at least 30 minutes of aerobic exercise at least 5 days/week (for weight loss, you may need as much as 60-90 minutes). This can be any activity that gets your heart rate up. This can be divided in 10-15 minute intervals if needed, but try and build up your endurance at least once a week.  Weight bearing exercise is also recommended twice weekly.  Eat a healthy diet with lots of vegetables, fruits and fiber.  "Colorful" foods have a lot of vitamins (ie green vegetables, tomatoes, red peppers, etc).  Limit sweet tea, regular sodas and alcoholic beverages, all of which has a lot of calories and sugar.  Up to 1 alcoholic drink daily may be beneficial for women (unless trying to lose weight, watch sugars).  Drink a lot of water.  Calcium recommendations are 1200-1500 mg daily (1500 mg for postmenopausal women or women without ovaries), and vitamin D 1000 IU daily.  This should be obtained from diet and/or supplements (vitamins), and calcium should not be taken all at once, but in divided doses.  Monthly self breast exams and yearly mammograms for women over the age of 39 is recommended.  Sunscreen of at least SPF 30 should be used on all sun-exposed parts of the skin when outside between the hours of 10 am and 4 pm (not just when at beach or pool, but even with exercise, golf, tennis, and yard work!)  Use a sunscreen that says "broad spectrum" so it covers both UVA and UVB rays, and make sure to reapply every 1-2 hours.  Remember to change the batteries in your smoke detectors when changing your clock times in the spring and fall.  Use your seat belt every time you are in a car, and please drive safely and not be distracted with cell phones and texting while driving.  Start taking 2000 IU of Vitamin D3 daily after you complete the prescription course.  You need to take this long-term.  I encourage you to look into the [facility]ariatric  surgery [facility]. Consider Qsymia or Saxenda as other alternative medical treatments to help with weight loss.  Please try and quit smoking--start thinking about why/when you smoke (habit, boredom, stress) in order to come up with effective strategies to cut back or quit. Available resources to help you quit include free counseling through [facility] ([place]uit[place] or 1-800-QUITNOW), smoking cessation classes through [facility] (call to find out schedule), over-the-counter nicotine replacements, and e-cigarettes (although this may not help break the hand-mouth habit).  Many insurance companies also have smoking cessation programs (which may decrease the cost of patches, meds if enrolled).  If these methods are not effective for you, and you are motivated to quit, return to discuss the possibility of prescription medications.

## 2016-11-26 ENCOUNTER — Encounter: Payer: Self-pay | Admitting: Family Medicine

## 2016-12-31 NOTE — Patient Instructions (Signed)
Your procedure is scheduled on:  Friday, January 09, 2017  [facility] at:  8:00 AM  Pick up the phone at the desk and dial [phone].  Call this number if you have problems the morning of surgery: [phone].  Remember: Do NOT eat food or drink after:  Midnight Thursday  Take these medicines the morning of surgery with a SIP OF WATER:  Xanax if needed  Stop ALL herbal medications at this time  Do NOT smoke the day of surgery.  Do NOT wear jewelry (body piercing), metal hair clips/bobby pins, make-up, or nail polish. Do NOT wear lotions, powders, or perfumes.  You may wear deodorant. Do NOT shave for 48 hours prior to surgery. Do NOT bring valuables to the hospital. Contacts, dentures, or bridgework may not be worn into surgery.  Have a responsible adult drive you home and stay with you for 24 hours after your procedure  Bring a copy of your healthcare power of attorney and living will documents.

## 2017-01-01 ENCOUNTER — Encounter (HOSPITAL_COMMUNITY): Payer: Self-pay

## 2017-01-01 ENCOUNTER — Encounter (HOSPITAL_COMMUNITY)
Admission: RE | Admit: 2017-01-01 | Discharge: 2017-01-01 | Disposition: A | Discharge status: Home / Self Care | Payer: 59 | Source: Ambulatory Visit | Attending: Obstetrics and Gynecology | Admitting: Obstetrics and Gynecology

## 2017-01-01 DIAGNOSIS — Z01818 Encounter for other preprocedural examination: Secondary | ICD-10-CM | POA: Insufficient documentation

## 2017-01-01 LAB — CBC
HCT: 43.9 % (ref 36.0–46.0)
Hemoglobin: 14.5 g/dL (ref 12.0–15.0)
MCH: 32.8 pg (ref 26.0–34.0)
MCHC: 33 g/dL (ref 30.0–36.0)
MCV: 99.3 fL (ref 78.0–100.0)
PLATELETS: 262 10*3/uL (ref 150–400)
RBC: 4.42 MIL/uL (ref 3.87–5.11)
RDW: 14.3 % (ref 11.5–15.5)
WBC: 10.3 10*3/uL (ref 4.0–10.5)

## 2017-01-09 ENCOUNTER — Encounter (HOSPITAL_COMMUNITY): Payer: Self-pay | Admitting: Anesthesiology

## 2017-01-09 ENCOUNTER — Encounter (HOSPITAL_COMMUNITY)
Admission: RE | Disposition: A | Discharge status: Home / Self Care | Payer: Self-pay | Source: Ambulatory Visit | Attending: Obstetrics and Gynecology

## 2017-01-09 ENCOUNTER — Ambulatory Visit (HOSPITAL_COMMUNITY)
Admission: RE | Admit: 2017-01-09 | Discharge: 2017-01-09 | Disposition: A | Discharge status: Home / Self Care | Payer: 59 | Source: Ambulatory Visit | Attending: Obstetrics and Gynecology | Admitting: Obstetrics and Gynecology

## 2017-01-09 ENCOUNTER — Ambulatory Visit (HOSPITAL_COMMUNITY): Payer: 59 | Admitting: Anesthesiology

## 2017-01-09 DIAGNOSIS — Z823 Family history of stroke: Secondary | ICD-10-CM | POA: Insufficient documentation

## 2017-01-09 DIAGNOSIS — Z8269 Family history of other diseases of the musculoskeletal system and connective tissue: Secondary | ICD-10-CM | POA: Insufficient documentation

## 2017-01-09 DIAGNOSIS — F17210 Nicotine dependence, cigarettes, uncomplicated: Secondary | ICD-10-CM | POA: Insufficient documentation

## 2017-01-09 DIAGNOSIS — G4730 Sleep apnea, unspecified: Secondary | ICD-10-CM | POA: Diagnosis not present

## 2017-01-09 DIAGNOSIS — Z833 Family history of diabetes mellitus: Secondary | ICD-10-CM | POA: Insufficient documentation

## 2017-01-09 DIAGNOSIS — E669 Obesity, unspecified: Secondary | ICD-10-CM | POA: Diagnosis not present

## 2017-01-09 DIAGNOSIS — Z801 Family history of malignant neoplasm of trachea, bronchus and lung: Secondary | ICD-10-CM | POA: Diagnosis not present

## 2017-01-09 DIAGNOSIS — Z8249 Family history of ischemic heart disease and other diseases of the circulatory system: Secondary | ICD-10-CM | POA: Diagnosis not present

## 2017-01-09 DIAGNOSIS — Z818 Family history of other mental and behavioral disorders: Secondary | ICD-10-CM | POA: Diagnosis not present

## 2017-01-09 DIAGNOSIS — Z302 Encounter for sterilization: Secondary | ICD-10-CM | POA: Diagnosis not present

## 2017-01-09 DIAGNOSIS — Z79899 Other long term (current) drug therapy: Secondary | ICD-10-CM | POA: Diagnosis not present

## 2017-01-09 DIAGNOSIS — F419 Anxiety disorder, unspecified: Secondary | ICD-10-CM | POA: Insufficient documentation

## 2017-01-09 DIAGNOSIS — Z825 Family history of asthma and other chronic lower respiratory diseases: Secondary | ICD-10-CM | POA: Diagnosis not present

## 2017-01-09 HISTORY — PX: BILATERAL SALPINGECTOMY: SHX5743

## 2017-01-09 LAB — PREGNANCY, URINE: PREG TEST UR: NEGATIVE

## 2017-01-09 SURGERY — SALPINGECTOMY, BILATERAL, OPEN
Anesthesia: General | Site: Abdomen | Laterality: Bilateral

## 2017-01-09 MED ORDER — HYDROMORPHONE HCL 1 MG/ML IJ SOLN
0.2500 mg | INTRAMUSCULAR | Status: DC | PRN
  Administered 2017-01-09: 0.5 mg via INTRAVENOUS

## 2017-01-09 MED ORDER — ROCURONIUM BROMIDE 100 MG/10ML IV SOLN
INTRAVENOUS | Status: AC
Start: 2017-01-09 — End: 2017-01-09
  Filled 2017-01-09: qty 1

## 2017-01-09 MED ORDER — BUPIVACAINE HCL (PF) 0.25 % IJ SOLN
INTRAMUSCULAR | Status: AC
  Filled 2017-01-09: qty 30

## 2017-01-09 MED ORDER — FENTANYL CITRATE (PF) 100 MCG/2ML IJ SOLN
INTRAMUSCULAR | Status: DC | PRN
  Administered 2017-01-09: 100 ug via INTRAVENOUS
  Administered 2017-01-09: 50 ug via INTRAVENOUS
  Administered 2017-01-09 (×2): 100 ug via INTRAVENOUS

## 2017-01-09 MED ORDER — HYDROCODONE-ACETAMINOPHEN 5-325 MG PO TABS: 1.0000 | ORAL_TABLET | Freq: Four times a day (QID) | ORAL | 0 refills | Status: DC | PRN

## 2017-01-09 MED ORDER — SUGAMMADEX SODIUM 500 MG/5ML IV SOLN
INTRAVENOUS | Status: AC
  Filled 2017-01-09: qty 5

## 2017-01-09 MED ORDER — SUCCINYLCHOLINE CHLORIDE 20 MG/ML IJ SOLN
INTRAMUSCULAR | Status: DC | PRN
  Administered 2017-01-09: 120 mg via INTRAVENOUS

## 2017-01-09 MED ORDER — BUPIVACAINE HCL (PF) 0.25 % IJ SOLN
INTRAMUSCULAR | Status: DC | PRN
  Administered 2017-01-09: 13 mL

## 2017-01-09 MED ORDER — METOCLOPRAMIDE HCL 5 MG/ML IJ SOLN: 10.0000 mg | Freq: Once | INTRAMUSCULAR | Status: DC | PRN

## 2017-01-09 MED ORDER — SUGAMMADEX SODIUM 500 MG/5ML IV SOLN
INTRAVENOUS | Status: DC | PRN
  Administered 2017-01-09: 265.4 mg via INTRAVENOUS

## 2017-01-09 MED ORDER — KETOROLAC TROMETHAMINE 30 MG/ML IJ SOLN
INTRAMUSCULAR | Status: AC
  Filled 2017-01-09: qty 1

## 2017-01-09 MED ORDER — ONDANSETRON HCL 4 MG/2ML IJ SOLN
INTRAMUSCULAR | Status: DC | PRN
  Administered 2017-01-09: 4 mg via INTRAVENOUS

## 2017-01-09 MED ORDER — SCOPOLAMINE 1 MG/3DAYS TD PT72
1.0000 | MEDICATED_PATCH | Freq: Once | TRANSDERMAL | Status: DC
  Administered 2017-01-09: 1.5 mg via TRANSDERMAL

## 2017-01-09 MED ORDER — FENTANYL CITRATE (PF) 250 MCG/5ML IJ SOLN
INTRAMUSCULAR | Status: AC
Start: 2017-01-09 — End: 2017-01-09
  Filled 2017-01-09: qty 5

## 2017-01-09 MED ORDER — HYDROCODONE-ACETAMINOPHEN 7.5-325 MG PO TABS: 1.0000 | ORAL_TABLET | Freq: Once | ORAL | Status: DC | PRN

## 2017-01-09 MED ORDER — MEPERIDINE HCL 25 MG/ML IJ SOLN: 6.2500 mg | INTRAMUSCULAR | Status: DC | PRN

## 2017-01-09 MED ORDER — LACTATED RINGERS IV SOLN
INTRAVENOUS | Status: DC
  Administered 2017-01-09: 10:00:00 via INTRAVENOUS
  Administered 2017-01-09: 75 mL/h via INTRAVENOUS

## 2017-01-09 MED ORDER — ROCURONIUM BROMIDE 100 MG/10ML IV SOLN
INTRAVENOUS | Status: DC | PRN
  Administered 2017-01-09: 40 mg via INTRAVENOUS

## 2017-01-09 MED ORDER — MIDAZOLAM HCL 2 MG/2ML IJ SOLN
INTRAMUSCULAR | Status: AC
  Filled 2017-01-09: qty 2

## 2017-01-09 MED ORDER — FENTANYL CITRATE (PF) 100 MCG/2ML IJ SOLN
INTRAMUSCULAR | Status: AC
  Filled 2017-01-09: qty 2

## 2017-01-09 MED ORDER — PROPOFOL 10 MG/ML IV BOLUS
INTRAVENOUS | Status: AC
Start: 2017-01-09 — End: 2017-01-09
  Filled 2017-01-09: qty 20

## 2017-01-09 MED ORDER — SODIUM CHLORIDE 0.9 % IJ SOLN
INTRAMUSCULAR | Status: AC
  Filled 2017-01-09: qty 10

## 2017-01-09 MED ORDER — MIDAZOLAM HCL 2 MG/2ML IJ SOLN
INTRAMUSCULAR | Status: DC | PRN
  Administered 2017-01-09: 2 mg via INTRAVENOUS

## 2017-01-09 MED ORDER — DEXAMETHASONE SODIUM PHOSPHATE 10 MG/ML IJ SOLN
INTRAMUSCULAR | Status: AC
  Filled 2017-01-09: qty 1

## 2017-01-09 MED ORDER — PROPOFOL 10 MG/ML IV BOLUS
INTRAVENOUS | Status: DC | PRN
  Administered 2017-01-09: 200 mg via INTRAVENOUS

## 2017-01-09 MED ORDER — KETOROLAC TROMETHAMINE 30 MG/ML IJ SOLN
INTRAMUSCULAR | Status: DC | PRN
Start: 2017-01-09 — End: 2017-01-09
  Administered 2017-01-09: 30 mg via INTRAVENOUS

## 2017-01-09 MED ORDER — GLYCOPYRROLATE 0.2 MG/ML IJ SOLN
INTRAMUSCULAR | Status: AC
  Filled 2017-01-09: qty 1

## 2017-01-09 MED ORDER — DEXAMETHASONE SODIUM PHOSPHATE 10 MG/ML IJ SOLN
INTRAMUSCULAR | Status: DC | PRN
  Administered 2017-01-09: 10 mg via INTRAVENOUS

## 2017-01-09 MED ORDER — ONDANSETRON HCL 4 MG/2ML IJ SOLN
INTRAMUSCULAR | Status: AC
  Filled 2017-01-09: qty 2

## 2017-01-09 MED ORDER — LIDOCAINE HCL (CARDIAC) 20 MG/ML IV SOLN
INTRAVENOUS | Status: DC | PRN
  Administered 2017-01-09: 100 mg via INTRAVENOUS

## 2017-01-09 MED ORDER — SODIUM CHLORIDE 0.9 % IJ SOLN
INTRAMUSCULAR | Status: DC | PRN
  Administered 2017-01-09: 10 mL

## 2017-01-09 MED ORDER — HYDROMORPHONE HCL 1 MG/ML IJ SOLN
INTRAMUSCULAR | Status: AC
  Filled 2017-01-09: qty 1

## 2017-01-09 MED ORDER — GLYCOPYRROLATE 0.2 MG/ML IJ SOLN
INTRAMUSCULAR | Status: DC | PRN
  Administered 2017-01-09: 0.2 mg via INTRAVENOUS

## 2017-01-09 MED ORDER — LIDOCAINE HCL (CARDIAC) 20 MG/ML IV SOLN
INTRAVENOUS | Status: AC
  Filled 2017-01-09: qty 5

## 2017-01-09 MED ORDER — SCOPOLAMINE 1 MG/3DAYS TD PT72
MEDICATED_PATCH | TRANSDERMAL | Status: AC
  Administered 2017-01-09: 1.5 mg via TRANSDERMAL
  Filled 2017-01-09: qty 1

## 2017-01-09 SURGICAL SUPPLY — 28 items
ADH SKN CLS APL DERMABOND .7 (GAUZE/BANDAGES/DRESSINGS) ×2
CATH ROBINSON RED A/P 16FR (CATHETERS) ×4 IMPLANT
CLOTH BEACON ORANGE TIMEOUT ST (SAFETY) ×4 IMPLANT
DERMABOND ADVANCED (GAUZE/BANDAGES/DRESSINGS) ×2
DERMABOND ADVANCED .7 DNX12 (GAUZE/BANDAGES/DRESSINGS) ×2 IMPLANT
DRSG OPSITE POSTOP 3X4 (GAUZE/BANDAGES/DRESSINGS) ×3 IMPLANT
DURAPREP 26ML APPLICATOR (WOUND CARE) ×4 IMPLANT
GLOVE BIO SURGEON STRL SZ8 (GLOVE) ×4 IMPLANT
GLOVE BIOGEL PI IND STRL 7.0 (GLOVE) ×4 IMPLANT
GLOVE BIOGEL PI INDICATOR 7.0 (GLOVE) ×4
GLOVE ORTHO TXT STRL SZ7.5 (GLOVE) ×4 IMPLANT
GOWN STRL REUS W/TWL LRG LVL3 (GOWN DISPOSABLE) ×8 IMPLANT
NEEDLE INSUFFLATION 120MM (ENDOMECHANICALS) ×4 IMPLANT
PACK LAPAROSCOPY BASIN (CUSTOM PROCEDURE TRAY) ×4 IMPLANT
PACK TRENDGUARD 450 HYBRID PRO (MISCELLANEOUS) IMPLANT
PACK TRENDGUARD 600 HYBRD PROC (MISCELLANEOUS) ×2 IMPLANT
PROTECTOR NERVE ULNAR (MISCELLANEOUS) ×8 IMPLANT
SLEEVE XCEL OPT CAN 5 100 (ENDOMECHANICALS) IMPLANT
SUT VIC AB 3-0 CTX 36 (SUTURE) IMPLANT
SUT VIC AB 3-0 PS2 18 (SUTURE) ×4
SUT VIC AB 3-0 PS2 18XBRD (SUTURE) ×2 IMPLANT
SUT VICRYL 0 UR6 27IN ABS (SUTURE) IMPLANT
TOWEL OR 17X24 6PK STRL BLUE (TOWEL DISPOSABLE) ×8 IMPLANT
TRENDGUARD 450 HYBRID PRO PACK (MISCELLANEOUS)
TRENDGUARD 600 HYBRID PROC PK (MISCELLANEOUS) ×4
TROCAR XCEL NON-BLD 11X100MML (ENDOMECHANICALS) ×4 IMPLANT
TROCAR XCEL NON-BLD 5MMX100MML (ENDOMECHANICALS) ×4 IMPLANT
WARMER LAPAROSCOPE (MISCELLANEOUS) ×4 IMPLANT

## 2017-01-09 NOTE — Anesthesia Postprocedure Evaluation (Signed)
Anesthesia Post Note  Patient: [name] Silva  Procedure(s) Performed: Procedure(s) (LRB): LAPAROSCOPIC SALPINGECTOMY (Bilateral)     Patient location during evaluation: PACU Anesthesia Type: General Level of consciousness: awake and alert and oriented Pain management: pain level controlled Vital Signs Assessment: post-procedure vital signs reviewed and stable Respiratory status: spontaneous breathing, nonlabored ventilation and respiratory function stable Cardiovascular status: blood pressure returned to baseline and stable Postop Assessment: no signs of nausea or vomiting Anesthetic complications: no    Last Vitals:  Vitals:   01/09/17 1049 01/09/17 1056  BP:    Pulse: 99 96  Resp: 19 14  Temp:      Last Pain:  Vitals:   01/09/17 1056  TempSrc:   PainSc: 2    Pain Goal: Patients Stated Pain Goal: 3 (01/09/17 1056)               Zaden Sako A.

## 2017-01-09 NOTE — Anesthesia Procedure Notes (Signed)
Procedure Name: Intubation Date/Time: 01/09/2017 9:29 AM Performed by: Rica RecordsICKELTON, Mliss Wedin Pre-anesthesia Checklist: Patient identified, Emergency Drugs available, Suction available and Patient being monitored Patient Re-evaluated:Patient Re-evaluated prior to inductionOxygen Delivery Method: Circle system utilized Preoxygenation: Pre-oxygenation with 100% oxygen Intubation Type: IV induction Ventilation: Mask ventilation without difficulty Laryngoscope Size: Miller and 2 Grade View: Grade I Number of attempts: 1 Airway Equipment and Method: Patient positioned with wedge pillow and Stylet Placement Confirmation: ETT inserted through vocal cords under direct vision,  positive ETCO2 and CO2 detector Secured at: 21 cm Tube secured with: Tape Dental Injury: Teeth and Oropharynx as per pre-operative assessment

## 2017-01-09 NOTE — Discharge Instructions (Addendum)
Routine instructions for laparoscopic tubal  Laparoscopic Tubal Ligation, Care After Refer to this sheet in the next few weeks. These instructions provide you with information about caring for yourself after your procedure. Your health care provider may also give you more specific instructions. Your treatment has been planned according to current medical practices, but problems sometimes occur. Call your health care provider if you have any problems or questions after your procedure. What can I expect after the procedure? After the procedure, it is common to have:  A sore throat.  Discomfort in your shoulder.  Mild discomfort or cramping in your abdomen.  Gas pains.  Pain or soreness in the area where the surgical cut (incision) was made.  A bloated feeling.  Tiredness.  Nausea.  Vomiting.  Follow these instructions at home: Medicines  Take over-the-counter and prescription medicines only as told by your health care provider.  Do not take aspirin because it can cause bleeding.  Do not drive or operate heavy machinery while taking prescription pain medicine. Activity  Rest for the rest of the day.  Return to your normal activities as told by your health care provider. Ask your health care provider what activities are safe for you. Incision care   Follow instructions from your health care provider about how to take care of your incision. Make sure you: ? Wash your hands with soap and water before you change your bandage (dressing). If soap and water are not available, use hand sanitizer. ? Change your dressing as told by your health care provider. ? Leave stitches (sutures) in place. They may need to stay in place for 2 weeks or longer.  Check your incision area every day for signs of infection. Check for: ? More redness, swelling, or pain. ? More fluid or blood. ? Warmth. ? Pus or a bad smell. Other Instructions  Do not take baths, swim, or use a hot tub until your  health care provider approves. You may take showers.  Keep all follow-up visits as told by your health care provider. This is important.  Have someone help you with your daily household tasks for the first few days. Contact a health care provider if:  You have more redness, swelling, or pain around your incision.  Your incision feels warm to the touch.  You have pus or a bad smell coming from your incision.  The edges of your incision break open after the sutures have been removed.  Your pain does not improve after 2-3 days.  You have a rash.  You repeatedly become dizzy or light-headed.  Your pain medicine is not helping.  You are constipated. Get help right away if:  You have a fever.  You faint.  You have increasing pain in your abdomen.  You have severe pain in one or both of your shoulders.  You have fluid or blood coming from your sutures or from your vagina.  You have shortness of breath or difficulty breathing.  You have chest pain or leg pain.  You have ongoing nausea, vomiting, or diarrhea. This information is not intended to replace advice given to you by your health care provider. Make sure you discuss any questions you have with your health care provider. Document Released: 02/14/2005 Document Revised: 12/31/2015 Document Reviewed: 07/08/2015 Elsevier Interactive Patient Education  2018 [employer]  Post Anesthesia Home Care Instructions  No ibuprofen products until: 5:30 today  Activity: Get plenty of rest for the remainder of the day. A responsible individual must  stay with you for 24 hours following the procedure.  For the next 24 hours, DO NOT: -Drive a car -Advertising copywriter -Drink alcoholic beverages -Take any medication unless instructed by your physician -Make any legal decisions or sign important papers.  Meals: Start with liquid foods such as gelatin or soup. Progress to regular foods as tolerated. Avoid greasy, spicy, heavy foods.  If nausea and/or vomiting occur, drink only clear liquids until the nausea and/or vomiting subsides. Call your physician if vomiting continues.  Special Instructions/Symptoms: Your throat may feel dry or sore from the anesthesia or the breathing tube placed in your throat during surgery. If this causes discomfort, gargle with warm salt water. The discomfort should disappear within 24 hours.  If you had a scopolamine patch placed behind your ear for the management of post- operative nausea and/or vomiting:  1. The medication in the patch is effective for 72 hours, after which it should be removed.  Wrap patch in a tissue and discard in the trash. Wash hands thoroughly with soap and water. 2. You may remove the patch earlier than 72 hours if you experience unpleasant side effects which may include dry mouth, dizziness or visual disturbances. 3. Avoid touching the patch. Wash your hands with soap and water after contact with the patch.

## 2017-01-09 NOTE — Op Note (Signed)
Preoperative diagnosis: Desires surgical sterility Postoperative diagnosis: Same Procedure: Laparoscopic bilateral salpingectomy Surgeon: Lavina Hammanodd Glendi Mohiuddin M.D. Anesthesia: Gen. Endotracheal tube Findings: She had a normal abdomen and pelvis with normal uterus tubes and ovaries Specimens: Bilateral fallopian tubes Estimated blood loss: Minimal Complications: None  Procedure in detail  The patient was taken to the operating room and placed in the dorsosupine position. General anesthesia was induced. Her legs were placed in mobile stirrups and her left arm was tucked to her side. Abdomen perineum and vagina were then prepped and draped in the usual sterile fashion, bladder drained with a red robinson catheter, a Hulka tenaculum was applied to the cervix for uterine manipulation. Infraumbilical skin was then infiltrated with quarter percent Marcaine and a 1 cm vertical incision was made. The veress needle was inserted into the peritoneal cavity and placement confirmed by the water drop test and an opening pressure of 3 mm of mercury. CO2 was insufflated to a pressure of 14 mm of mercury and the veress needle was removed. A 10/11 disposable trocar was then introduced with direct visualization with the laparoscope. A 5 mm port was then placed low in the midline also under direct visualization. Inspection revealed the above-mentioned findings with normal anatomy. The distal end of each tube was grasped and elevated. Using bipolar cautery I was able to free the distal end of each tube from the ovary and fulgurate across the mesosalpinx and a proximal portion of the fallopian tube. Scissors were then used to remove fallopian tube.  This is done bilaterally without difficulty. Both segments of tube were removed through the umbilical trocar. The 5 mm port was removed under direct visualization. All gas was allowed to deflate from the abdomen and the umbilical trocar was removed. One figure-of-eight suture of 0 Vicryl  was placed in the umbilical incision. Skin incisions were then closed with interrupted subcuticular sutures of 4-0 Vicryl followed by Dermabond. The Hulka tenaculum was removed. The patient was taken down from stirrups. She was awakened in the operating room and taken to the recovery room in stable condition after tolerating the procedure well. Counts were correct and she had PAS hose on throughout the procedure.

## 2017-01-09 NOTE — Transfer of Care (Signed)
Immediate Anesthesia Transfer of Care Note  Patient: [name] Silva  Procedure(s) Performed: Procedure(s): LAPAROSCOPIC SALPINGECTOMY (Bilateral)  Patient Location: PACU  Anesthesia Type:General  Level of Consciousness: awake, alert  and oriented  Airway & Oxygen Therapy: Patient Spontanous Breathing and Patient connected to face mask oxygen  Post-op Assessment: Report given to RN and Post -op Vital signs reviewed and stable  Post vital signs: Reviewed and stable  Last Vitals:  Vitals:   01/09/17 0813  BP: 118/73  Pulse: 72  Resp: 16  Temp: 36.7 C    Last Pain:  Vitals:   01/09/17 0813  TempSrc: Oral      Patients Stated Pain Goal: 3 (01/09/17 0813)  Complications: No apparent anesthesia complications

## 2017-01-09 NOTE — H&P (Signed)
[name] Silva is an 41 y.o. female. She desires permanent sterility.  Pertinent Gynecological History: Last pap: normal Date: 02/2014 OB History: G1, P1001 LTCS for breech   Menstrual History: Patient's last menstrual period was 12/15/2016 (exact date).    Past Medical History:  Diagnosis Date  . Anxiety   . Carpal tunnel syndrome of left wrist 06/2016  . Dental crown present   . Obesity   . Sleep apnea    no CPAP use, mild  . Smoker   . Wears glasses     Past Surgical History:  Procedure Laterality Date  . CARPAL TUNNEL RELEASE Left 07/08/2016   Procedure: CARPAL TUNNEL RELEASE, left;  Surgeon: Cindee SaltGary Kuzma, MD;  Location: [facility];  Service: Orthopedics;  Laterality: Left;  FAB  . CESAREAN SECTION  04/10/2006    Family History  Problem Relation Age of Onset  . COPD Mother   . Lumbar disc disease Mother   . Cancer Mother        cervical cancer  . Anxiety disorder Mother   . Depression Mother   . Diabetes Father   . Hypertension Father   . Hyperlipidemia Father   . COPD Maternal Uncle        lung  . Cancer Maternal Uncle        lung cancer  . Stroke Maternal Grandmother   . Heart disease Maternal Grandfather     Social History:  reports that she has been smoking Cigarettes.  She has a 20.00 pack-year smoking history. She has never used smokeless tobacco. She reports that she drinks about 0.6 oz of alcohol per week . She reports that she does not use drugs.  Allergies: No Known Allergies  Prescriptions Prior to Admission  Medication Sig Dispense Refill Last Dose  . acetaminophen (TYLENOL) 500 MG tablet Take 500 mg by mouth 2 (two) times daily as needed for headache.   Past Month at Unknown time  . ALPRAZolam (XANAX) 0.5 MG tablet Take 0.5-1 tablets (0.25-0.5 mg total) by mouth 3 (three) times daily. (Patient taking differently: Take 0.25-0.5 mg by mouth 3 (three) times daily as needed for anxiety. ) 30 tablet 0 Past Month at Unknown time  .  citalopram (CELEXA) 10 MG tablet TAKE 1 TABLET BY MOUTH DAILY( GENERIC EQUIVALENT FOR CELEXA) (Patient taking differently: Take 10 mg by mouth every evening. TAKE 1 TABLET BY MOUTH DAILY( GENERIC EQUIVALENT FOR CELEXA)) 90 tablet 3 01/08/2017 at 1900  . Vitamin D, Ergocalciferol, (DRISDOL) 50000 units CAPS capsule Take 1 capsule (50,000 Units total) by mouth every 7 (seven) days. (Patient not taking: Reported on 12/29/2016) 12 capsule 0 Not Taking at Unknown time    Review of Systems  Respiratory: Negative.   Cardiovascular: Negative.     Blood pressure 118/73, pulse 72, temperature 98.1 F (36.7 C), temperature source Oral, resp. rate 16, last menstrual period 12/15/2016, SpO2 98 %. Physical Exam  Constitutional: She appears well-developed and well-nourished.  Neck: Neck supple. No thyromegaly present.  Cardiovascular: Normal rate, regular rhythm and normal heart sounds.   No murmur heard. Respiratory: Effort normal and breath sounds normal. No respiratory distress. She has no wheezes.  GI: Soft. She exhibits no distension and no mass. There is no tenderness.  Genitourinary: Vagina normal and uterus normal.    Results for orders placed or performed during the hospital encounter of 01/09/17 (from the past 24 hour(s))  Pregnancy, urine     Status: None   Collection Time: 01/09/17  8:00 AM  Result Value Ref Range   Preg Test, Ur NEGATIVE NEGATIVE    No results found.  Assessment/Plan: Desires permanent sterility.  All options for contraception have been discussed.  Surgical procedure, options, permanency and failure rates have been discussed.  Will admit for laparoscopic bilateral salpingectomy.  Leighton Roach Melvin Whiteford 01/09/2017, 8:44 AM

## 2017-01-09 NOTE — Anesthesia Preprocedure Evaluation (Addendum)
Anesthesia Evaluation  Patient identified by MRN, date of birth, ID band Patient awake    Reviewed: Allergy & Precautions, NPO status , Patient's Chart, lab work & pertinent test results  Airway Mallampati: II  TM Distance: >3 FB Neck ROM: Full    Dental no notable dental hx. (+) Teeth Intact   Pulmonary sleep apnea , Current Smoker,    Pulmonary exam normal breath sounds clear to auscultation       Cardiovascular negative cardio ROS Normal cardiovascular exam Rhythm:Regular Rate:Normal     Neuro/Psych PSYCHIATRIC DISORDERS Anxiety  Neuromuscular disease    GI/Hepatic negative GI ROS, Neg liver ROS,   Endo/Other  Morbid obesity  Renal/GU negative Renal ROS  negative genitourinary   Musculoskeletal negative musculoskeletal ROS (+)   Abdominal (+) + obese,   Peds  Hematology negative hematology ROS (+)   Anesthesia Other Findings   Reproductive/Obstetrics Desires Sterilization                            Anesthesia Physical Anesthesia Plan  ASA: III  Anesthesia Plan: General   Post-op Pain Management:    Induction: Intravenous  Airway Management Planned: Oral ETT  Additional Equipment:   Intra-op Plan:   Post-operative Plan: Extubation in OR  Informed Consent: I have reviewed the patients History and Physical, chart, labs and discussed the procedure including the risks, benefits and alternatives for the proposed anesthesia with the patient or authorized representative who has indicated his/her understanding and acceptance.   Dental advisory given  Plan Discussed with: CRNA, Anesthesiologist and Surgeon  Anesthesia Plan Comments:         Anesthesia Quick Evaluation

## 2017-01-10 ENCOUNTER — Encounter (HOSPITAL_COMMUNITY): Payer: Self-pay | Admitting: Obstetrics and Gynecology

## 2017-01-19 ENCOUNTER — Other Ambulatory Visit: Payer: Self-pay | Admitting: Family Medicine

## 2017-01-19 DIAGNOSIS — E559 Vitamin D deficiency, unspecified: Secondary | ICD-10-CM

## 2017-02-13 ENCOUNTER — Other Ambulatory Visit: Payer: Self-pay | Admitting: Family Medicine

## 2017-02-13 DIAGNOSIS — F411 Generalized anxiety disorder: Secondary | ICD-10-CM

## 2017-02-13 NOTE — Telephone Encounter (Signed)
Called in med to pharmacy  

## 2017-02-13 NOTE — Telephone Encounter (Signed)
Is this okay to call in? 

## 2017-02-13 NOTE — Telephone Encounter (Signed)
Ok for 30 day supply but no refills. She does not appear to be abusing them.

## 2017-02-23 ENCOUNTER — Encounter: Payer: Self-pay | Admitting: Family Medicine

## 2017-02-24 ENCOUNTER — Encounter: Payer: Self-pay | Admitting: Family Medicine

## 2017-02-24 ENCOUNTER — Ambulatory Visit (INDEPENDENT_AMBULATORY_CARE_PROVIDER_SITE_OTHER): Payer: Managed Care, Other (non HMO) | Admitting: Family Medicine

## 2017-02-24 VITALS — BP 128/84 | HR 72 | Ht 65.5 in | Wt 289.4 lb

## 2017-02-24 DIAGNOSIS — G5702 Lesion of sciatic nerve, left lower limb: Secondary | ICD-10-CM

## 2017-02-24 DIAGNOSIS — Z720 Tobacco use: Secondary | ICD-10-CM | POA: Diagnosis not present

## 2017-02-24 MED ORDER — CYCLOBENZAPRINE HCL 10 MG PO TABS: ORAL_TABLET | ORAL | 0 refills | Status: DC

## 2017-02-24 MED ORDER — NAPROXEN 500 MG PO TABS: 500.0000 mg | ORAL_TABLET | Freq: Two times a day (BID) | ORAL | 0 refills | Status: DC

## 2017-02-24 NOTE — Patient Instructions (Addendum)
  Stop the ibuprofen. Change to naproxen twice daily, and take it with food. Use the muscle relaxant at bedtime (and 1/2 tablet and with caution only if truly needed during the day--it likely will cause some sedation). Do the pyriformis stretches and other stretches as shown. These are best done after a heating pad or hot bath. Consider using a tennis ball to help with massage and trigger point release.  If you don't get improvement with these measures, next step would be physical therapy vs a course of steroids, however we know how these affect you, so let's try and avoid that and do the best you can with the home exercises and stretches.    Take your prescribed anti-inflammatory medication with food; discontinue or cut back the dose if you develop stomach pain/discomfort/side effects.  Do not take other over-the-counter pain medications such as ibuprofen, advil, motrin, aleve, naproxen at the same time.  Do not use longer than recommended.  It is okay to use acetaminophen (tylenol) along with this medication.

## 2017-02-24 NOTE — Progress Notes (Signed)
Chief Complaint  Patient presents with  . Sciatica    left sided sciatic pain.    3 weeks ago she started with left sided low back pain, radiating down the side of the left leg to her knee. Feels fine stting.  Hurts to stand, any weight bearing.  Leg isn't sore to touch, no pain with laying on the left side.   She called "tela-doc" yesterday who prescribed 800mg  ibuprofen. She took 2 doses yesterday, and doesn't notice improvement yet. Hasn't taken any yet today. Denies side effects.  Denies numbness, tingling or weakness in the left leg.  Denies any bladder/bowel changes or incontinence. Denies any dysuria or other urinary symptoms.  She recalls having a similar problem in the past; chart reviewed--treated for the same problems in 07/2014--treated with toradol injection, naproxen and flexeril.  She recalls the muscle relaxant helping.  She recalls not liking the psychiatric side effects of steroids, prefers not to take.  PMH, PSH, SH reviewed She continues to smoke. Outpatient Encounter Prescriptions as of 02/24/2017  Medication Sig  . ALPRAZolam (XANAX) 0.5 MG tablet TAKE 1/2 TO 1 TABLET BY MOUTH THREE TIMES DAILY  . citalopram (CELEXA) 10 MG tablet TAKE 1 TABLET BY MOUTH DAILY( GENERIC EQUIVALENT FOR CELEXA) (Patient taking differently: Take 10 mg by mouth every evening. TAKE 1 TABLET BY MOUTH DAILY( GENERIC EQUIVALENT FOR CELEXA))  . acetaminophen (TYLENOL) 500 MG tablet Take 500 mg by mouth 2 (two) times daily as needed for headache.  . [DISCONTINUED] HYDROcodone-acetaminophen (NORCO) 5-325 MG tablet Take 1 tablet by mouth every 6 (six) hours as needed for severe pain.  . [DISCONTINUED] Vitamin D, Ergocalciferol, (DRISDOL) 50000 units CAPS capsule Take 1 capsule (50,000 Units total) by mouth every 7 (seven) days. (Patient not taking: Reported on 12/29/2016)   No facility-administered encounter medications on file as of 02/24/2017.    No Known Allergies  ROS:  No fever, chills,  headaches, dizziness, nausea, vomiting, abdominal pain, numbness, tingling, urinary complaints, weakness, URI symptoms, cough, shortness of breath, chest pain.  See HPI  PHYSICAL EXAM:  BP 128/84 (BP Location: Left Arm, Patient Position: Sitting, Cuff Size: Normal)   Pulse 72   Ht 5' 5.5" (1.664 m)   Wt 289 lb 6.4 oz (131.3 kg)   LMP 02/06/2017   BMI 47.43 kg/m   Wt Readings from Last 3 Encounters:  02/24/17 289 lb 6.4 oz (131.3 kg)  01/01/17 292 lb 8 oz (132.7 kg)  11/05/16 293 lb 9.6 oz (133.2 kg)   Well appearing, pleasant female, sitting comfortably Heart: regular rate and rhythm Lungs: clear bilaterally Abdomen: soft, nontender Back: Spine nontender, no CVA tenderness, no SI joint tenderness or tenderness at sciatic notch. She has mild tenderness over the left trochanteric bursa.  No pain with IT band stretch with laying down.  Has pain down the left IT band with stretching with standing (legs crossed). Pain with pyriformis stretch. Normal DTR's, strength. Negative SLR. Psych: normal mood, affect, hygiene and grooming Skin: no rashes  ASSESSMENT/PLAN:  Pyriformis syndrome, left - NSAID, muscle relaxants, stretches shown. Risks/side effects of meds reviewed. Declines pain meds, call for tramadol if worse. PT if not improving - Plan: cyclobenzaprine (FLEXERIL) 10 MG tablet, naproxen (NAPROSYN) 500 MG tablet  Tobacco use - encouraged to quit  25 min visit, more than 1/2 spent counseling (risks/side effects of meds, stretching, core strengthening, smoking cessation, etc)   We discussed tramadol in detail--declines prescription today (pain usually eased off by sitting).   Stop  the ibuprofen. Change to naproxen twice daily, and take it with food. Use the muscle relaxant at bedtime (and 1/2 tablet and with caution only if truly needed during the day--it likely will cause some sedation). Do the pyriformis stretches and other stretches as shown. These are best done after a  heating pad or hot bath. Consider using a tennis ball to help with massage and trigger point release.  If you don't get improvement with these measures, next step would be physical therapy vs a course of steroids, however we know how these affect you, so let's try and avoid that and do the best you can with the home exercises and stretches.

## 2017-03-04 ENCOUNTER — Telehealth: Payer: Self-pay | Admitting: Family Medicine

## 2017-03-04 NOTE — Telephone Encounter (Signed)
Faxed back to [phone]

## 2017-03-04 NOTE — Telephone Encounter (Signed)
Pt came in and dropped of form to be completed. Sending back to Dr. Lynelle DoctorKnapp.

## 2017-03-04 NOTE — Telephone Encounter (Signed)
FFO--in red folder  Please stamp/scan and return

## 2017-03-05 ENCOUNTER — Encounter: Payer: Self-pay | Admitting: Family Medicine

## 2017-03-16 ENCOUNTER — Encounter: Payer: Self-pay | Admitting: Family Medicine

## 2017-03-16 NOTE — Telephone Encounter (Signed)
Please call in the #10 tramadol as pended.

## 2017-03-17 MED ORDER — TRAMADOL HCL 50 MG PO TABS: 50.0000 mg | ORAL_TABLET | Freq: Three times a day (TID) | ORAL | 0 refills | Status: DC | PRN

## 2017-06-16 ENCOUNTER — Other Ambulatory Visit: Payer: Self-pay | Admitting: Family Medicine

## 2017-06-16 DIAGNOSIS — F411 Generalized anxiety disorder: Secondary | ICD-10-CM

## 2017-06-16 NOTE — Telephone Encounter (Signed)
PLEASE ADVISE.

## 2017-06-16 NOTE — Telephone Encounter (Signed)
Ok to refill #30 (not sure why this came across under Vickie's name...)

## 2017-06-24 ENCOUNTER — Encounter: Payer: Self-pay | Admitting: Family Medicine

## 2017-06-24 ENCOUNTER — Ambulatory Visit (INDEPENDENT_AMBULATORY_CARE_PROVIDER_SITE_OTHER): Payer: Managed Care, Other (non HMO) | Admitting: Family Medicine

## 2017-06-24 VITALS — BP 138/86 | HR 68 | Temp 99.2°F | Ht 65.5 in | Wt 270.0 lb

## 2017-06-24 DIAGNOSIS — L219 Seborrheic dermatitis, unspecified: Secondary | ICD-10-CM | POA: Diagnosis not present

## 2017-06-24 MED ORDER — HYDROCORTISONE 2.5 % EX CREA: TOPICAL_CREAM | Freq: Two times a day (BID) | CUTANEOUS | 0 refills | Status: DC

## 2017-06-24 NOTE — Patient Instructions (Signed)
Seborrheic dermatitis.  No response to ketoconazole or TAC.  This is a stronger steroid than I would have used on the face, ? If that is contributing to the erythema. Compared to photo on phone from 10/1, looks better in that the left side is much less involved (was more symmetric then).  Use the prescription hydrocortisone cream sparingly, twice daily to the affected area until better.  This is a chronic condition that won't completely resolve long-term.  Use the medication twice daily as needed for flares.  Consider using a dandruff shampoo to rinse the face periodically.  If you don't get a response in the next 2 weeks, then call for the next line therapy, which would be elidel.  Be sure to seek care if there is increased redness, swelling, warmth, pain or crusting, as this could indicate an infection in the skin, and be treated differently.      Seborrheic Dermatitis, Adult Seborrheic dermatitis is a skin disease that causes red, scaly patches. It usually occurs on the scalp, and it is often called dandruff. The patches may appear on other parts of the body. Skin patches tend to appear where there are many oil glands in the skin. Areas of the body that are commonly affected include:  Scalp.  Skin folds of the body.  Ears.  Eyebrows.  Neck.  Face.  Armpits.  The bearded area of men's faces.  The condition may come and go for no known reason, and it is often long-lasting (chronic). What are the causes? The cause of this condition is not known. What increases the risk? This condition is more likely to develop in people who:  Have certain conditions, such as: ? HIV (human immunodeficiency virus). ? AIDS (acquired immunodeficiency syndrome). ? Parkinson disease. ? Mood disorders, such as depression.  Are 4440-39 years old.  What are the signs or symptoms? Symptoms of this condition include:  Thick scales on the scalp.  Redness on the face or in the armpits.  Skin  that is flaky. The flakes may be white or yellow.  Skin that seems oily or dry but is not helped with moisturizers.  Itching or burning in the affected areas.  How is this diagnosed? This condition is diagnosed with a medical history and physical exam. A sample of your skin may be tested (skin biopsy). You may need to see a skin specialist (dermatologist). How is this treated? There is no cure for this condition, but treatment can help to manage the symptoms. You may get treatment to remove scales, lower the risk of skin infection, and reduce swelling or itching. Treatment may include:  Creams that reduce swelling and irritation (steroids).  Creams that reduce skin yeast.  Medicated shampoo, soaps, moisturizing creams, or ointments.  Medicated moisturizing creams or ointments.  Follow these instructions at home:  Apply over-the-counter and prescription medicines only as told by your health care provider.  Use any medicated shampoo, soaps, skin creams, or ointments only as told by your health care provider.  Keep all follow-up visits as told by your health care provider. This is important. Contact a health care provider if:  Your symptoms do not improve with treatment.  Your symptoms get worse.  You have new symptoms. This information is not intended to replace advice given to you by your health care provider. Make sure you discuss any questions you have with your health care provider. Document Released: 07/28/2005 Document Revised: 02/15/2016 Document Reviewed: 11/15/2015 Elsevier Interactive Patient Education  2018 Elsevier  Inc.  

## 2017-06-24 NOTE — Progress Notes (Signed)
Chief Complaint  Patient presents with  . Rash    on face x 1 month. Treated with 2 different types of creams and neither of which has really helped.   . Flu Vaccine    declined.   She contacted [facility] for a rash about a month ago.  It started at the sides of her nose, flaky with small white bumps. They said it might be fungal and treated with nizoral cream. This wasn't effective. It started spreading down the nasolabial fold, and they diagnosed contact dermatitis and prescribed 0.025% TAC cream.  She used this for 2 weeks, stopped 2 days ago.  It helped at first, but then the redness came back.  It itches only slightly, infrequently.  It feels dry, and sometimes has slight bumps in the area.   Denies dandruff or flaking in her eyebrows. She has h/o eczema on her leg.  Boyfriend has facial hair, wonders if it could be from irritation/scratching from contact with coarse facial hair.  PMH, PSH, SH reviewed  Outpatient Encounter Medications as of 06/24/2017  Medication Sig  . citalopram (CELEXA) 10 MG tablet TAKE 1 TABLET BY MOUTH DAILY( GENERIC EQUIVALENT FOR CELEXA) (Patient taking differently: Take 10 mg by mouth every evening. TAKE 1 TABLET BY MOUTH DAILY( GENERIC EQUIVALENT FOR CELEXA))  . triamcinolone (KENALOG) 0.025 % cream APP AA TID  . ALPRAZolam (XANAX) 0.5 MG tablet TAKE 1/2 TO 1 TABLET BY MOUTH THREE TIMES DAILY AS NEEDED (Patient not taking: Reported on 06/24/2017)  . hydrocortisone 2.5 % cream Apply 2 (two) times daily topically. Use sparingly, until rash resolved, then as needed  . ketoconazole (NIZORAL) 2 % cream APPLY TO THE AFFECTED AREA EVERY 12 HOURS  . [DISCONTINUED] acetaminophen (TYLENOL) 500 MG tablet Take 500 mg by mouth 2 (two) times daily as needed for headache.  . [DISCONTINUED] cyclobenzaprine (FLEXERIL) 10 MG tablet Take 1/2 -1 tablet by mouth every 8 hours as needed for muscle spasm  . [DISCONTINUED] naproxen (NAPROSYN) 500 MG tablet Take 1 tablet (500 mg total)  by mouth 2 (two) times daily with a meal.  . [DISCONTINUED] traMADol (ULTRAM) 50 MG tablet Take 1 tablet (50 mg total) by mouth every 8 (eight) hours as needed for severe pain.   No facility-administered encounter medications on file as of 06/24/2017.    (2.5% HC cream rx'd today, not prior to visit).  No Known Allergies  ROS:  No fever, chills, URI symptoms, other rashes or other complaints. +intentional weight loss  PHYSICAL EXAM: BP 138/86   Pulse 68   Temp 99.2 F (37.3 C) (Tympanic)   Ht 5' 5.5" (1.664 m)   Wt 270 lb (122.5 kg)   LMP 06/02/2017   BMI 44.25 kg/m   Wt Readings from Last 3 Encounters:  06/24/17 270 lb (122.5 kg)  02/24/17 289 lb 6.4 oz (131.3 kg)  01/01/17 292 lb 8 oz (132.7 kg)    Well appearing, pleasant female, in no distress There is some mild redness at left paranasal area, not extending into the nasolabial fold.  On the right side, there is erythema extending into the nasolabial fold, as well as the fold from the corner of the mouth down the fold toward the chin.  There are a few small papules.  No crusting, soft tissue swelling, or warmth  Eyebrows and scalp appear normal, no erythema or flaking.   ASSESSMENT/PLAN:  Seborrheic dermatitis - Plan: hydrocortisone 2.5 % cream  Declines flu shot.  Seborrheic dermatitis.  No response  to ketoconazole or TAC.  This is a stronger steroid than I would have used on the face, ? If that is contributing to the erythema. Compared to photo on phone from 10/1, looks better in that the left side is much less involved (was more symmetric then).  Use the prescription hydrocortisone cream sparingly, twice daily to the affected area until better.  This is a chronic condition that won't completely resolve long-term.  Use the medication twice daily as needed for flares.  Consider using a dandruff shampoo to rinse the face periodically.  If you don't get a response in the next 2 weeks, then call for the next line  therapy, which would be elidel.  Be sure to seek care if there is increased redness, swelling, warmth, pain or crusting, as this could indicate an infection in the skin, and be treated differently.     F/u as scheduled in April (CPE)

## 2017-06-30 ENCOUNTER — Encounter: Payer: Self-pay | Admitting: Family Medicine

## 2017-06-30 DIAGNOSIS — R21 Rash and other nonspecific skin eruption: Secondary | ICD-10-CM

## 2017-06-30 NOTE — Telephone Encounter (Signed)
Please refer to Dr. Terri PiedraLupton for worsening facial rash. Photo attached to her last message, seen recently in the office for this.  Thanks

## 2017-07-09 ENCOUNTER — Other Ambulatory Visit: Payer: Self-pay | Admitting: Family Medicine

## 2017-07-09 DIAGNOSIS — F411 Generalized anxiety disorder: Secondary | ICD-10-CM

## 2017-07-09 NOTE — Telephone Encounter (Signed)
Is this okay to refill? Looks like she says in her request that the pharmacy never received? Didn't you print and give to her?

## 2017-07-10 HISTORY — PX: CARPAL TUNNEL RELEASE: SHX101

## 2017-07-10 MED ORDER — ALPRAZOLAM 0.5 MG PO TABS: 0.2500 mg | ORAL_TABLET | Freq: Three times a day (TID) | ORAL | 0 refills | Status: DC | PRN

## 2017-07-10 NOTE — Telephone Encounter (Signed)
This was a refill request from 11/6, and it was authorized to refill #30 and [facility] to [facility].  I don't know if Lurena JoinerRebecca called it in, and it accidentally said "print" when it was signed.  She was seen for the facial rash on 11/14 and didn't ask about it. Didn't mention not getting it.  Since it appears it didn't get done, I'm sending it in now.  Sending back as FYI, since I'm not sure what the problem is, why it wasn't phoned into the pharmacy on 11/6 or 11/7.

## 2017-07-13 NOTE — Telephone Encounter (Signed)
Called pharmacy and spoke with Julieta Guttingavid pharm tech- he reports they did not receive the rx from 06/17/2017. [facility] does have script for 11/30- it is fill and ready for pick up

## 2017-08-10 ENCOUNTER — Encounter: Payer: Self-pay | Admitting: Family Medicine

## 2017-08-11 MED ORDER — CYCLOBENZAPRINE HCL 10 MG PO TABS
5.0000 mg | ORAL_TABLET | Freq: Three times a day (TID) | ORAL | 0 refills | Status: DC | PRN
Start: 2017-08-11 — End: 2017-11-09

## 2017-08-26 ENCOUNTER — Other Ambulatory Visit: Payer: Self-pay | Admitting: Family Medicine

## 2017-08-26 DIAGNOSIS — F411 Generalized anxiety disorder: Secondary | ICD-10-CM

## 2017-11-05 ENCOUNTER — Telehealth: Payer: Self-pay | Admitting: Family Medicine

## 2017-11-05 DIAGNOSIS — E559 Vitamin D deficiency, unspecified: Secondary | ICD-10-CM

## 2017-11-05 DIAGNOSIS — Z0000 Encounter for general adult medical examination without abnormal findings: Secondary | ICD-10-CM

## 2017-11-05 NOTE — Telephone Encounter (Signed)
This was sent to me.

## 2017-11-05 NOTE — Telephone Encounter (Signed)
I entered labs, please schedule lab visit

## 2017-11-05 NOTE — Telephone Encounter (Signed)
New Message  Pt verbalized she is needing orders for her lab work prior to her appt.  Pt verbalized to contact her when the best time for her to come in.  Please f/u

## 2017-11-05 NOTE — Telephone Encounter (Signed)
Left message asking patient to call and schedule appt for labs for tomorrow am.

## 2017-11-05 NOTE — Addendum Note (Signed)
Addended byJoselyn Arrow: Sierra Spargo on: 11/05/2017 11:55 AM   Modules accepted: Orders

## 2017-11-08 NOTE — Progress Notes (Signed)
Chief Complaint  Patient presents with  . Annual Exam    nonfasting annual exam. Did not come in for labs, would like to do Wed. Has form for work that needs to be filled out. Also requesting Hep C blood test-boyfriend has Hep C. Sees Dr. Jackelyn Knife for GYN and has eye appt next week.     [name] Silva is a 42 y.o. female who presents for a complete physical.  She has the following concerns:  Her boyfriend started treatment for hepatitis C in December.  Counts are now zero, but had potential exposure prior to completing treatment, so would like hepatitis C test done with her labs. She was too busy at work to get here prior to today's appointment, plans to return for fasting labs.  Anxiety: She continues on citalopram 10mg  daily.  Denies side effects.  Someone was fired this week at work, so increased stress and workload.  She needs to use an alprazolam to break a panic attack about 2-3 times/month.  Overall moods are good.  Last refilled alprazolam #30 11/30, and prior to that was filled in early July 2018. Daughter has been getting counseling, planning to see psychiatrist for medications tomorrow (OCD, anxiety).  In a new relationship since August, currently living together, and moods are good.  She does not expect to have a tough time this month (anniversary).  Sees dermatologist regularly (since had facial rash).  Treated with doxycycline and topical lotion/shampoo.  Rash resolved.  Tobacco use:  She continues to smoke 1 PPD.  Her boyfriend smokes also.  She had increased her smoking up to 1PPD after her husband passed away.  Vitamin D deficiency: Last level was low of 18 in March 2018. She was treated with prescription D, and advised to take 2000 IU daily upon completion.  She never started any OTC supplements.  OSA:  She had sleep study done 11/2015. This showed: IMPRESSIONS - Mild obstructive sleep apnea occurred during this study (AHI = 7.8/h). - Insufficient events to meet  protocol requirements for split CPAP titration on this study night - No significant central sleep apnea occurred during this study (CAI = 0.2/h). - Mild oxygen desaturation was noted during this study (Min O2 = 86.00%). - The patient snored with Moderate snoring volume. - No cardiac abnormalities were noted during this study. - Clinically significant periodic limb movements did not occur during sleep. No significant associated arousals.  DIAGNOSIS - Obstructive Sleep Apnea (327.23 [G47.33 ICD-10])  RECOMMENDATIONS - Very mild obstructive sleep apnea. Return to discuss treatment options. - Avoid alcohol, sedatives and other CNS depressants that may worsen sleep apnea and disrupt normal sleep architecture. - Sleep hygiene should be reviewed to assess factors that may improve sleep quality. - Weight management and regular exercise should be initiated or continued if appropriate.  She reports she likes to sleep, doesn't want to get out of bed in the mornings, but doesn't feel unrefreshed. Denies daytime somnolence. She has lost a significant amount of weight since then (was 283# then).  Obesity: She has lost a lot of weight by eating healthier.  She cut back on drinking sweet tea, mostly drinks water.  She uses a vanilla cream in her coffee, no longer adding sugar.   Immunization History  Administered Date(s) Administered  . Influenza,inj,Quad PF,6+ Mos 04/20/2014, 04/30/2015  . Pneumococcal Polysaccharide-23 04/20/2014  . Tdap 05/20/2011   Refused flu shot Last Pap smear: UTD--(Dr. Jackelyn Knife); sees him yearly Last mammogram: never  Last colonoscopy: never  Last DEXA: never  Ophtho: every 2 years, wears glasses; has appointment scheduled for this month Dentist: twice yearly  Exercise: Walks around the yard with the puppy.  Past Medical History:  Diagnosis Date  . Anxiety   . Carpal tunnel syndrome of left wrist 06/2016  . Dental crown present   . Obesity   . Sleep apnea     no CPAP use, mild  . Smoker   . Wears glasses     Past Surgical History:  Procedure Laterality Date  . BILATERAL SALPINGECTOMY Bilateral 01/09/2017   Procedure: LAPAROSCOPIC SALPINGECTOMY;  Surgeon: Lavina HammanMeisinger, Todd, MD;  Location: [facility];  Service: Gynecology;  Laterality: Bilateral;  . CARPAL TUNNEL RELEASE Left 07/08/2016   Procedure: CARPAL TUNNEL RELEASE, left;  Surgeon: Cindee SaltGary Kuzma, MD;  Location: [facility];  Service: Orthopedics;  Laterality: Left;  FAB  . CARPAL TUNNEL RELEASE Right 07/10/2017  . CESAREAN SECTION  04/10/2006    Social History   Socioeconomic History  . Marital status: Widowed    Spouse name: Not on file  . Number of children: 1  . Years of education: Not on file  . Highest education level: Not on file  Occupational History  . Occupation: office work    Employer: [employer]  Social Needs  . Financial resource strain: Not on file  . Food insecurity:    Worry: Not on file    Inability: Not on file  . Transportation needs:    Medical: Not on file    Non-medical: Not on file  Tobacco Use  . Smoking status: Current Every Day Smoker    Packs/day: 1.00    Years: 20.00    Pack years: 20.00    Types: Cigarettes  . Smokeless tobacco: Never Used  Substance and Sexual Activity  . Alcohol use: Yes    Alcohol/week: 0.6 oz    Types: 1 Standard drinks or equivalent per week    Comment: socially, a few drinks/month  . Drug use: No  . Sexual activity: Yes    Partners: Male    Birth control/protection: Surgical  Lifestyle  . Physical activity:    Days per week: Not on file    Minutes per session: Not on file  . Stress: Not on file  Relationships  . Social connections:    Talks on phone: Not on file    Gets together: Not on file    Attends religious service: Not on file    Active member of club or organization: Not on file    Attends meetings of clubs or organizations: Not on file    Relationship status: Not on file  . Intimate partner  violence:    Fear of current or ex partner: Not on file    Emotionally abused: Not on file    Physically abused: Not on file    Forced sexual activity: Not on file  Other Topics Concern  . 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  Husband had motorcycle accident, and had amputation of lower leg 11/2012--he passed away 01/2016.   Currently living with her daughter Carmie Kanner(Callie), boyfriend Tommy, 2 dogs and a chinchilla    Family History  Problem Relation Age of Onset  . COPD Mother   . Lumbar disc disease Mother   . Cancer Mother        cervical cancer  . Anxiety disorder Mother   . Depression Mother   .  Diabetes Father   . Hypertension Father   . Hyperlipidemia Father   . COPD Maternal Uncle        lung  . Cancer Maternal Uncle        lung cancer  . Stroke Maternal Grandmother   . Heart disease Maternal Grandfather     Outpatient Encounter Medications as of 11/09/2017  Medication Sig  . ALPRAZolam (XANAX) 0.5 MG tablet Take 0.5-1 tablets (0.25-0.5 mg total) by mouth 3 (three) times daily as needed.  . citalopram (CELEXA) 10 MG tablet TAKE 1 TABLET BY MOUTH DAILY  . [DISCONTINUED] cyclobenzaprine (FLEXERIL) 10 MG tablet Take 0.5-1 tablets (5-10 mg total) by mouth 3 (three) times daily as needed for muscle spasms.  . [DISCONTINUED] doxycycline (VIBRAMYCIN) 100 MG capsule   . [DISCONTINUED] hydrocortisone 2.5 % cream Apply 2 (two) times daily topically. Use sparingly, until rash resolved, then as needed  . [DISCONTINUED] ketoconazole (NIZORAL) 2 % cream APPLY TO THE AFFECTED AREA EVERY 12 HOURS  . [DISCONTINUED] ketoconazole (NIZORAL) 2 % shampoo   . [DISCONTINUED] triamcinolone (KENALOG) 0.025 % cream APP AA TID   No facility-administered encounter medications on file as of 11/09/2017.    Allergies  Allergen Reactions  . Amoxicillin-Pot Clavulanate Rash   ROS: The patient denies anorexia, headaches, vision  changes, decreased hearing, ear pain, breast concerns, chest pain, palpitations, dizziness, syncope, dyspnea on exertion, swelling, nausea, vomiting, diarrhea, constipation, abdominal pain, melena, hematochezia, indigestion/heartburn, hematuria, incontinence, dysuria, irregular menstrual cycles, vaginal discharge, odor or itch, genital lesions, joint pains, weakness, tremor, suspicious skin lesions, depression, abnormal bleeding/bruising, or enlarged lymph nodes.  +anxiety, per HPI, controlled. No fever, chills, URI symptoms. +intentional weight loss (30# in the last year)   PHYSICAL EXAM:  BP 130/88   Pulse 72   Ht 5\' 6"  (1.676 m)   Wt 262 lb 6.4 oz (119 kg)   BMI 42.35 kg/m  Stressed/rushed at work to get here for appointment.  General Appearance:  Alert, cooperative, no distress, appears stated age. She declined to get into gown for exam (since sees GYN and derm). +cigarette smoke odor in room.  Head:  Normocephalic, without obvious abnormality, atraumatic   Eyes:  PERRL, conjunctiva/corneas clear, EOM's intact, fundi benign.   Ears:  Normal TM's and external ear canals   Nose:  Nares normal, mucosa is mildly edematous without erythema or purulence, no drainage or sinus tenderness   Throat:  Lips, mucosa, and tongue normal; teeth and gums normal.   Neck:  Supple, no lymphadenopathy; thyroid: no enlargement/tenderness/ nodules; no carotid bruit or JVD   Back:  Spine nontender, no curvature, ROM normal, no CVA tenderness   Lungs:  Clear to auscultation bilaterally without wheezes, rales or ronchi; respirations unlabored   Chest Wall:  No tenderness or deformity   Heart:  Regular rate and rhythm, S1 and S2 normal, no murmur, rub or gallop   Breast Exam:  Deferred to GYN   Abdomen:  Soft, non-tender, nondistended, normoactive bowel sounds, no masses, no hepatosplenomegaly. Obese   Genitalia:  Deferred to GYN      Extremities:  No clubbing, cyanosis or edema    Pulses:  2+ and symmetric all extremities   Skin:  Skin color, texture, turgor normal. No rashes/lesions visualized, limited exam. Tattoo on right lower leg (and previously noted on back, not examined today)  Lymph nodes:  Cervical and supraclavicular nodes are normal   Neurologic:  CNII-XII intact, normal strength, sensation and gait; reflexes 2+ and symmetric throughout  Psych:  Normal mood, affect, hygiene and grooming   ASSESSMENT/PLAN:  Annual physical exam - Plan: POCT Urinalysis DIP (Proadvantage Device)  Vitamin D deficiency - noncompliant with OTC supplement, expect to be low again. Rec 2000 IU long-term (after likely needing another rx course)  Generalized anxiety disorder - Plan: citalopram (CELEXA) 10 MG tablet  Obesity, morbid, BMI 40.0-49.9 (HCC) - congratulated on weight loss, encouraged further. Discussed exercise, diet  OSA (obstructive sleep apnea) - was mild; no longer symptomatic. Encouraged further weight loss  Tobacco use - counseled and encouraged cessation  Exposure to hepatitis C - Plan: Hepatitis C antibody  Generalized anxiety disorder - overall fairly well controlled with citalopram.  Continue alprazolam prn. Stress reduction/exercise discussed - Plan: citalopram (CELEXA) 10 MG tablet    Return for fasting labs.  Discussed monthly self breast exams and yearly mammograms after the age of 58 (due ethis summer); at least 30 minutes of aerobic activity at least 5 days/week, weight-bearing exercise at least 2x/wk; proper sunscreen use reviewed; healthy diet, including goals of calcium and vitamin D intake and alcohol recommendations (less than or equal to 1 drink/day) reviewed; regular seatbelt use; changing batteries in smoke detectors. Immunization recommendations discussed--yearly flu shot recommended, refuses. Colonoscopy recommendations reviewed, age 82. Smoking cessation encouraged   f/u 1 year for CPE. Will return  soon for fasting labs.  Added Hep C Ab to previously entered future orders

## 2017-11-09 ENCOUNTER — Ambulatory Visit (INDEPENDENT_AMBULATORY_CARE_PROVIDER_SITE_OTHER): Payer: Managed Care, Other (non HMO) | Admitting: Family Medicine

## 2017-11-09 ENCOUNTER — Encounter: Payer: Self-pay | Admitting: Family Medicine

## 2017-11-09 VITALS — BP 130/88 | HR 72 | Ht 66.0 in | Wt 262.4 lb

## 2017-11-09 DIAGNOSIS — Z205 Contact with and (suspected) exposure to viral hepatitis: Secondary | ICD-10-CM

## 2017-11-09 DIAGNOSIS — F411 Generalized anxiety disorder: Secondary | ICD-10-CM | POA: Diagnosis not present

## 2017-11-09 DIAGNOSIS — Z0000 Encounter for general adult medical examination without abnormal findings: Secondary | ICD-10-CM

## 2017-11-09 DIAGNOSIS — Z720 Tobacco use: Secondary | ICD-10-CM

## 2017-11-09 DIAGNOSIS — G4733 Obstructive sleep apnea (adult) (pediatric): Secondary | ICD-10-CM | POA: Diagnosis not present

## 2017-11-09 DIAGNOSIS — E559 Vitamin D deficiency, unspecified: Secondary | ICD-10-CM

## 2017-11-09 LAB — POCT URINALYSIS DIP (PROADVANTAGE DEVICE)
BILIRUBIN UA: NEGATIVE
GLUCOSE UA: NEGATIVE mg/dL
Ketones, POC UA: NEGATIVE mg/dL
NITRITE UA: NEGATIVE
Protein Ur, POC: NEGATIVE mg/dL
Specific Gravity, Urine: 1.02
UUROB: NEGATIVE
pH, UA: 7 (ref 5.0–8.0)

## 2017-11-09 MED ORDER — CITALOPRAM HYDROBROMIDE 10 MG PO TABS: ORAL_TABLET | ORAL | 3 refills | Status: DC

## 2017-11-09 NOTE — Patient Instructions (Addendum)
  HEALTH MAINTENANCE RECOMMENDATIONS:  It is recommended that you get at least 30 minutes of aerobic exercise at least 5 days/week (for weight loss, you may need as much as 60-90 minutes). This can be any activity that gets your heart rate up. This can be divided in 10-15 minute intervals if needed, but try and build up your endurance at least once a week.  Weight bearing exercise is also recommended twice weekly.  Eat a healthy diet with lots of vegetables, fruits and fiber.  "Colorful" foods have a lot of vitamins (ie green vegetables, tomatoes, red peppers, etc).  Limit sweet tea, regular sodas and alcoholic beverages, all of which has a lot of calories and sugar.  Up to 1 alcoholic drink daily may be beneficial for women (unless trying to lose weight, watch sugars).  Drink a lot of water.  Calcium recommendations are 1200-1500 mg daily (1500 mg for postmenopausal women or women without ovaries), and vitamin D 1000 IU daily.  This should be obtained from diet and/or supplements (vitamins), and calcium should not be taken all at once, but in divided doses.  Monthly self breast exams and yearly mammograms for women over the age of 140 is recommended.  Sunscreen of at least SPF 30 should be used on all sun-exposed parts of the skin when outside between the hours of 10 am and 4 pm (not just when at beach or pool, but even with exercise, golf, tennis, and yard work!)  Use a sunscreen that says "broad spectrum" so it covers both UVA and UVB rays, and make sure to reapply every 1-2 hours.  Remember to change the batteries in your smoke detectors when changing your clock times in the spring and fall.  Use your seat belt every time you are in a car, and please drive safely and not be distracted with cell phones and texting while driving.  Please work on cutting back on your smoking, working with your boyfriend to set a quit date. Please try and quit smoking--start thinking about why/when you smoke (habit,  boredom, stress) in order to come up with effective strategies to cut back or quit. Available resources to help you quit include free counseling through [facility] ([place]uit[facility] or 1-800-QUITNOW), smoking cessation classes through [facility] (call to find out schedule), over-the-counter nicotine replacements, and e-cigarettes (although this may not help break the hand-mouth habit).  Many insurance companies also have smoking cessation programs (which may decrease the cost of patches, meds if enrolled).  If these methods are not effective for you, and you are motivated to quit, return to discuss the possibility of prescription medications.  Try pairing your smoking cessation efforts with regular exercise in order to prevent weight gain. Goal is 150 minutes of cardio each week, which can be in 10-15 minute intervals.  I suspect that your vitamin D level will be very low again. It is very important to get 1000-2000 IU of Vitamin D3 daily, long-term (either from multivitamin, or separate Vitamin D3 supplement.). I suspect you will need another 12 week course of prescription once your labs are done.  It is important once you finish the prescription to start taking a daily supplement (you can take a multivitamin while on the prescription D).

## 2017-11-11 ENCOUNTER — Other Ambulatory Visit: Payer: Managed Care, Other (non HMO)

## 2017-11-13 ENCOUNTER — Other Ambulatory Visit: Payer: Managed Care, Other (non HMO)

## 2017-11-13 DIAGNOSIS — Z0000 Encounter for general adult medical examination without abnormal findings: Secondary | ICD-10-CM

## 2017-11-13 DIAGNOSIS — E559 Vitamin D deficiency, unspecified: Secondary | ICD-10-CM

## 2017-11-13 DIAGNOSIS — Z205 Contact with and (suspected) exposure to viral hepatitis: Secondary | ICD-10-CM

## 2017-11-14 LAB — COMPREHENSIVE METABOLIC PANEL
A/G RATIO: 1.6 (ref 1.2–2.2)
ALK PHOS: 91 IU/L (ref 39–117)
ALT: 17 IU/L (ref 0–32)
AST: 19 IU/L (ref 0–40)
Albumin: 4.2 g/dL (ref 3.5–5.5)
BILIRUBIN TOTAL: 0.5 mg/dL (ref 0.0–1.2)
BUN/Creatinine Ratio: 14 (ref 9–23)
BUN: 14 mg/dL (ref 6–20)
CHLORIDE: 99 mmol/L (ref 96–106)
CO2: 22 mmol/L (ref 20–29)
Calcium: 9.4 mg/dL (ref 8.7–10.2)
Creatinine, Ser: 1 mg/dL (ref 0.57–1.00)
GFR calc non Af Amer: 71 mL/min/{1.73_m2} (ref >59)
GFR, EST AFRICAN AMERICAN: 82 mL/min/{1.73_m2} (ref >59)
Globulin, Total: 2.6 g/dL (ref 1.5–4.5)
Glucose: 77 mg/dL (ref 65–99)
POTASSIUM: 4.6 mmol/L (ref 3.5–5.2)
Sodium: 138 mmol/L (ref 134–144)
Total Protein: 6.8 g/dL (ref 6.0–8.5)

## 2017-11-14 LAB — CBC WITH DIFFERENTIAL/PLATELET
BASOS ABS: 0 10*3/uL (ref 0.0–0.2)
Basos: 1 %
EOS (ABSOLUTE): 0.1 10*3/uL (ref 0.0–0.4)
EOS: 1 %
HEMATOCRIT: 47.1 % — AB (ref 34.0–46.6)
HEMOGLOBIN: 15.8 g/dL (ref 11.1–15.9)
IMMATURE GRANS (ABS): 0 10*3/uL (ref 0.0–0.1)
Immature Granulocytes: 0 %
LYMPHS: 32 %
Lymphocytes Absolute: 2.1 10*3/uL (ref 0.7–3.1)
MCH: 35 pg — AB (ref 26.6–33.0)
MCHC: 33.5 g/dL (ref 31.5–35.7)
MCV: 104 fL — ABNORMAL HIGH (ref 79–97)
MONOCYTES: 6 %
Monocytes Absolute: 0.4 10*3/uL (ref 0.1–0.9)
NEUTROS ABS: 4 10*3/uL (ref 1.4–7.0)
Neutrophils: 60 %
Platelets: 210 10*3/uL (ref 150–379)
RBC: 4.52 x10E6/uL (ref 3.77–5.28)
RDW: 15.3 % (ref 12.3–15.4)
WBC: 6.7 10*3/uL (ref 3.4–10.8)

## 2017-11-14 LAB — VITAMIN D 25 HYDROXY (VIT D DEFICIENCY, FRACTURES): VIT D 25 HYDROXY: 16.6 ng/mL — AB (ref 30.0–100.0)

## 2017-11-14 LAB — HEPATITIS C ANTIBODY

## 2017-11-14 LAB — LIPID PANEL
CHOL/HDL RATIO: 2.7 ratio (ref 0.0–4.4)
Cholesterol, Total: 184 mg/dL (ref 100–199)
HDL: 69 mg/dL (ref >39)
LDL Calculated: 103 mg/dL — ABNORMAL HIGH (ref 0–99)
Triglycerides: 60 mg/dL (ref 0–149)
VLDL Cholesterol Cal: 12 mg/dL (ref 5–40)

## 2017-11-16 MED ORDER — VITAMIN D (ERGOCALCIFEROL) 1.25 MG (50000 UNIT) PO CAPS: 50000.0000 [IU] | ORAL_CAPSULE | ORAL | 0 refills | Status: DC

## 2017-11-16 NOTE — Addendum Note (Signed)
Addended by: Joselyn ArrowKNAPP, Caela Huot on: 11/16/2017 08:40 AM   Modules accepted: Orders

## 2017-12-29 ENCOUNTER — Other Ambulatory Visit: Payer: Self-pay | Admitting: Family Medicine

## 2017-12-29 DIAGNOSIS — F411 Generalized anxiety disorder: Secondary | ICD-10-CM

## 2017-12-29 NOTE — Telephone Encounter (Signed)
Is this ok to send in? 

## 2018-03-16 ENCOUNTER — Other Ambulatory Visit: Payer: Self-pay | Admitting: Family Medicine

## 2018-03-16 DIAGNOSIS — E559 Vitamin D deficiency, unspecified: Secondary | ICD-10-CM

## 2018-06-14 ENCOUNTER — Other Ambulatory Visit: Payer: Self-pay | Admitting: Family Medicine

## 2018-06-14 ENCOUNTER — Telehealth: Payer: Self-pay | Admitting: *Deleted

## 2018-06-14 DIAGNOSIS — Z0000 Encounter for general adult medical examination without abnormal findings: Secondary | ICD-10-CM

## 2018-06-14 DIAGNOSIS — E559 Vitamin D deficiency, unspecified: Secondary | ICD-10-CM

## 2018-06-14 DIAGNOSIS — F411 Generalized anxiety disorder: Secondary | ICD-10-CM

## 2018-06-14 NOTE — Telephone Encounter (Signed)
Is this okay to refill? 

## 2018-06-14 NOTE — Telephone Encounter (Signed)
Left message asking patient.

## 2018-06-14 NOTE — Telephone Encounter (Signed)
I entered orders. I did NOT check lipids, as they have been normal in the past. See if she needs them done for a form/insurance, and if so, add in lipid order please

## 2018-06-14 NOTE — Telephone Encounter (Signed)
Patient scheduled CPE for 12/09/18 and labs for 12/06/18-need orders for the lab visit.

## 2018-06-14 NOTE — Telephone Encounter (Signed)
Last filled 5/21.  Refilled. She has no f/u visits scheduled; needs to schedule CPE for 11/2018

## 2018-06-16 NOTE — Addendum Note (Signed)
Addended by: Debbrah Alar F on: 06/16/2018 10:19 AM   Modules accepted: Orders

## 2018-06-16 NOTE — Telephone Encounter (Signed)
Patient called back so I went ahead and added lipids.

## 2018-07-28 ENCOUNTER — Other Ambulatory Visit: Payer: Self-pay | Admitting: Internal Medicine

## 2018-09-08 ENCOUNTER — Ambulatory Visit: Payer: Self-pay | Admitting: Child and Adolescent Psychiatry

## 2018-09-13 ENCOUNTER — Encounter: Payer: Self-pay | Admitting: Internal Medicine

## 2018-09-13 DIAGNOSIS — F4329 Adjustment disorder with other symptoms: Secondary | ICD-10-CM | POA: Insufficient documentation

## 2018-09-13 DIAGNOSIS — F4321 Adjustment disorder with depressed mood: Secondary | ICD-10-CM | POA: Insufficient documentation

## 2018-09-13 DIAGNOSIS — Z634 Disappearance and death of family member: Secondary | ICD-10-CM

## 2018-09-13 NOTE — Progress Notes (Signed)
Date of Service: 08/16/2018   Bereavement Visit    Patient Name: [name] Silva        DOB: 1976-07-05  Age: 43 y.o. MRN#: [id number] Attending Physician: No att. providers found Primary Care Physician: Joselyn Arrow, MD  Problem List:  Patient Active Problem List   Diagnosis Date Noted  . 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 OSA (obstructive sleep apnea) 05/07/2016  . Vitamin D deficiency 11/05/2015  . Borderline high blood pressure 09/27/2013  . Morbid obesity with BMI of 45.0-49.9, adult (HCC) 09/26/2013  . Generalized anxiety disorder 06/02/2011  . Tobacco use disorder 05/20/2011  . 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 [name] Silva experienced the death of her husband in 14-Dec-2015 and this was preceded by a prolonged illness where she was the primary caregiver with multiple prolonged hospital stays and debility, this was followed by the illness of her mother that began in 2016/12/13 and her mother died 07-31-09 with metastatic throat cancer, she is an only child and was responsible for all of her mothers care and decsions making. Her mother was in hospice prior to her death.   [name] Silva was identified as having multiple risk factors for complicated grief including: Pre-existing Anxiety/Depression, Poor Social Support Systems and Multiple recent losses.  Subjective: Continues to have periods of intensely negative thoughts about her mother's death and also her husband-mostly guilt and feeling like she has not been able to adjust to the losses. +feels "paraylzed", has been able to do a few things re: mothers affairs, her current SO has been ill and she is now back in a caregiving role again and he has serious illness, she remains very worried about her daughter Fidela Juneau  and how she is coping with the losses as well. Has not been able to work, disorganized thoughts, fatigue, inability to focus.  ROS: +tension, +HA, +anxious, +fatigue, -SI, +irritability  Past Medical History:  Diagnosis Date  . Anxiety   . Carpal tunnel syndrome of left wrist 06/2016  . Dental crown present   . Obesity   . Sleep apnea    no CPAP use, mild  . Smoker   . Wears glasses    Social History   Social History Narrative   Widowed, 1 daughter, not exercising, works in Paramedic, Corporate treasurer at [employer].  Husband had motorcycle accident, and had amputation of lower leg 11/2012--he passed away 02-13-16.   Currently living with her daughter Carmie Kanner), boyfriend Tommy, 2 dogs and a chinchilla    GRIEF THERAPY: Assessment & Plan   BRIEF GRIEF QUESTIONNAIRE SCORE: 3 Primary Diagnosis: Complicated Grief  1. Follow up with PCP regularly, Maintain current anxiety medications 2. Attend Hospice Loss Group if schedule allows 3. Positive Self Care 4. Continue CBT techniques  Patient requests short term disability paperwork be completed -increasing difficulty maintaining her focus and attention at work, often has to miss work or leave early. I agree that this is a reasonable request while she continues to work through complex grief issues.   Anderson Malta, DO Palliative Medicine  Please contact Palliative Medicine Team phone at [phone] for questions and concerns.

## 2018-09-13 NOTE — Progress Notes (Signed)
Date of Service: 07/28/2018   Bereavement Visit    Patient Name: [name] Silva        DOB: 06/03/1976  Age: 43 y.o. MRN#: [id number] Attending Physician: No att. providers found Primary Care Physician: Joselyn Arrow, MD  Problem List:  Patient Active Problem List   Diagnosis Date Noted  . 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 OSA (obstructive sleep apnea) 05/07/2016  . Vitamin D deficiency 11/05/2015  . Borderline high blood pressure 09/27/2013  . Morbid obesity with BMI of 45.0-49.9, adult (HCC) 09/26/2013  . Generalized anxiety disorder 06/02/2011  . Tobacco use disorder 05/20/2011  . 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 [name] Silva experienced the death of her husband in 11/28/2015 and this was preceded by a prolonged illness where she was the primary caregiver with multiple prolonged hospital stays and debility, this was followed by the illness of her mother that began in 2016/11/27 and her mother died 07-15-2009 with metastatic throat cancer, she is an only child and was responsible for all of her mothers care and decsions making. Her mother was in hospice prior to her death.   [name] Silva was identified as having multiple risk factors for complicated grief including: Pre-existing Anxiety/Depression, Poor Social Support Systems and Multiple recent losses.  Subjective: +Difficulty Sleeping, Recurring Negative thoughts about the Future and how she has handled her mothers affairs, +tearful, difficulty handling everyday tasks and prioritizing, growing fears about the future.  ROS: +tension, +HA, +anxious, +fatigue, -SI, +irritability  Past Medical History:  Diagnosis Date  . Anxiety   . Carpal tunnel syndrome of left wrist 06/2016  . Dental crown present   .  Obesity   . Sleep apnea    no CPAP use, mild  . Smoker   . Wears glasses    Social History   Social History Narrative   Widowed, 1 daughter, not exercising, works in Paramedic, Corporate treasurer at [employer].  Husband had motorcycle accident, and had amputation of lower leg 11/2012--he passed away January 28, 2016.   Currently living with her daughter [name] Silva), boyfriend Tommy, 2 dogs and a chinchilla    GRIEF THERAPY: Assessment & Plan   BRIEF GRIEF QUESTIONNAIRE SCORE: 4 Primary Diagnosis: Complicated Grief  1. Follow up with PCP regularly, Maintain current anxiety medications. 2. Attend Hospice Loss Group if schedule allows 3. Positive Self Care 4. Legal Aid Referral to help with managing her mom's estate affairs and financial concerns. 5. CBT techniques discussed.  Patient requested completion of FMLA paperwork today this has been completed. Return to work 08/16/2018.   Anderson Malta, DO Palliative Medicine  Please contact Palliative Medicine Team phone at [phone] for questions and concerns.

## 2018-11-21 ENCOUNTER — Other Ambulatory Visit: Payer: Self-pay | Admitting: Family Medicine

## 2018-11-21 DIAGNOSIS — F411 Generalized anxiety disorder: Secondary | ICD-10-CM

## 2018-11-22 ENCOUNTER — Other Ambulatory Visit: Payer: Self-pay | Admitting: Family Medicine

## 2018-11-22 DIAGNOSIS — F411 Generalized anxiety disorder: Secondary | ICD-10-CM

## 2018-11-22 NOTE — Telephone Encounter (Signed)
Is this okay to refill? 

## 2018-11-23 NOTE — Telephone Encounter (Signed)
Last filed 06/2018. Scheduled for 4/30. refilled

## 2018-11-23 NOTE — Telephone Encounter (Signed)
[facility] is requesting to fill pt Xanax. Please advise [facility]

## 2018-12-03 ENCOUNTER — Other Ambulatory Visit: Payer: Self-pay

## 2018-12-03 DIAGNOSIS — Z0000 Encounter for general adult medical examination without abnormal findings: Secondary | ICD-10-CM

## 2018-12-03 DIAGNOSIS — E559 Vitamin D deficiency, unspecified: Secondary | ICD-10-CM

## 2018-12-04 LAB — LIPID PANEL
Chol/HDL Ratio: 2.8 ratio (ref 0.0–4.4)
Cholesterol, Total: 207 mg/dL — ABNORMAL HIGH (ref 100–199)
HDL: 74 mg/dL (ref >39)
LDL Calculated: 121 mg/dL — ABNORMAL HIGH (ref 0–99)
Triglycerides: 60 mg/dL (ref 0–149)
VLDL Cholesterol Cal: 12 mg/dL (ref 5–40)

## 2018-12-04 LAB — COMPREHENSIVE METABOLIC PANEL
ALT: 11 IU/L (ref 0–32)
AST: 15 IU/L (ref 0–40)
Albumin/Globulin Ratio: 1.6 (ref 1.2–2.2)
Albumin: 4.2 g/dL (ref 3.8–4.8)
Alkaline Phosphatase: 80 IU/L (ref 39–117)
BUN/Creatinine Ratio: 18 (ref 9–23)
BUN: 17 mg/dL (ref 6–24)
Bilirubin Total: 0.4 mg/dL (ref 0.0–1.2)
CO2: 23 mmol/L (ref 20–29)
Calcium: 9.4 mg/dL (ref 8.7–10.2)
Chloride: 99 mmol/L (ref 96–106)
Creatinine, Ser: 0.92 mg/dL (ref 0.57–1.00)
GFR calc Af Amer: 90 mL/min/{1.73_m2} (ref >59)
GFR calc non Af Amer: 78 mL/min/{1.73_m2} (ref >59)
Globulin, Total: 2.7 g/dL (ref 1.5–4.5)
Glucose: 92 mg/dL (ref 65–99)
Potassium: 4.1 mmol/L (ref 3.5–5.2)
Sodium: 138 mmol/L (ref 134–144)
Total Protein: 6.9 g/dL (ref 6.0–8.5)

## 2018-12-04 LAB — VITAMIN D 25 HYDROXY (VIT D DEFICIENCY, FRACTURES): Vit D, 25-Hydroxy: 14.5 ng/mL — ABNORMAL LOW (ref 30.0–100.0)

## 2018-12-04 LAB — CBC WITH DIFFERENTIAL/PLATELET
Basophils Absolute: 0 10*3/uL (ref 0.0–0.2)
Basos: 1 %
EOS (ABSOLUTE): 0.1 10*3/uL (ref 0.0–0.4)
Eos: 2 %
Hematocrit: 44.1 % (ref 34.0–46.6)
Hemoglobin: 15.2 g/dL (ref 11.1–15.9)
Immature Grans (Abs): 0 10*3/uL (ref 0.0–0.1)
Immature Granulocytes: 0 %
Lymphocytes Absolute: 2.6 10*3/uL (ref 0.7–3.1)
Lymphs: 37 %
MCH: 34.9 pg — ABNORMAL HIGH (ref 26.6–33.0)
MCHC: 34.5 g/dL (ref 31.5–35.7)
MCV: 101 fL — ABNORMAL HIGH (ref 79–97)
Monocytes Absolute: 0.5 10*3/uL (ref 0.1–0.9)
Monocytes: 7 %
Neutrophils Absolute: 3.8 10*3/uL (ref 1.4–7.0)
Neutrophils: 53 %
Platelets: 225 10*3/uL (ref 150–450)
RBC: 4.36 x10E6/uL (ref 3.77–5.28)
RDW: 12.3 % (ref 11.7–15.4)
WBC: 7.1 10*3/uL (ref 3.4–10.8)

## 2018-12-04 LAB — TSH: TSH: 2.45 u[IU]/mL (ref 0.450–4.500)

## 2018-12-05 NOTE — Progress Notes (Signed)
Start time: 9:38 End time: 10:14  Virtual Visit via Video Note  I connected with [name] Silva on 12/06/2018 by a video enabled telemedicine application (zoom) and verified that I am speaking with the correct person using two identifiers. Patient is at home, daughter is outside. MD is in the office.   I discussed the limitations of evaluation and management by telemedicine and the availability of in person appointments. The patient expressed understanding and agreed to proceed. She also understands that this visit will be billed to her insurance. She currently is in the process of figuring it out (issues with Medicaid), and temporarily does not have insurance. (she knows she will be billed, and that can be submitted once her insurance issue is rectified).   History of Present Illness: Chief Complaint  Patient presents with  . Depression    med check. No new concerns.     Patient presents for med check.  She had labs prior to her appointment.  This is a virtual visit, and her physical exam was rescheduled to October (due to the COVID-19 pandemic).   Anxiety: She continues on citalopram  daily without side effects.She needs to use an alprazolam to break a panic attack about 2-3 times/month.  Alprazolam was recently refilled (#30 on 11/23/2018, prior to that was 06/2018.   She has had a lot happen in the last few months. She denies depression or worsening anxiety, but admits "I am a crybaby" and was tearful in discussion today.  Her mother was diagnosed with cancer in 05/12/2018, died in 2018-08-11(laryngeal, metastatic).  She then lost her job in January.   Her mother left her $40,000 in cash, but it was stolen. Her boyfriend had variceal bleeding on 1/13, was in ICU for a week.  She reports she lost her job due to missed work (caring for him, time off for her mother, needing time for her daughter's appointments).  She reports now that she is "glad to be gone from that place", it had  gotten "terrible".  She is currently collecting unemployment. Her daughter is having difficulty with homeschooling.  She has anxiety, ADD.  She changed psychiatrist, changed meds recently. Her boyfriend's health has improved.  He has been sober for over 100 days.   Earlier this year had some grief counseling through [facility] (Dr. Phillips Odor)  Tobacco use: Since being "stuck at home with nothing to do" related to the coronavirus pandemic, she has been smoking more, up to 1.5 PPD.  Her boyfriend also smokes though just recently got a prescription for patches and plans to work on quitting.  Vitamin D deficiency: Last level was low of 16.6 in 11/2017.She was treated with prescription D, and advised to take 2000 IU daily upon completion.  She had labs done prior to today's visit  She admits that she has not been taking any vitamins/supplements.  OSA: She had sleep study done4/2017. This showed mild OSA, mild oxygen desaturation (86%). She had been advised of the following recommendations: RECOMMENDATIONS - Very mild obstructive sleep apnea.  - Avoid alcohol, sedatives and other CNS depressants that may worsen sleep apnea and disrupt normal sleep architecture. - Sleep hygiene should be reviewed to assess factors that may improve sleep quality. - Weight management and regular exercise should be initiated or continued if appropriate.  She denies unrefreshed sleep or daytime somnolence.  Her weight at the time of the study was 283#, and she has continued to lose weight.  Obesity: She has lost  a lot of weight by eating healthier, getting regular exercise. She had been going to the gym prior to them closing related to COVID.  She is walking now some.  Went back to school, got GED. Plans to start college, buthadn't enrolled yet ([facility])  PMH, PSH, SH reviewed FH updated.   Outpatient Encounter Medications as of 12/06/2018  Medication Sig Note  . ALPRAZolam (XANAX) 0.5 MG tablet TAKE 1/2 TO 1  TABLET BY MOUTH THREE TIMES DAILY AS NEEDED 12/06/2018: Once a week at most  . citalopram (CELEXA) 10 MG tablet TAKE 1 TABLET BY MOUTH DAILY   . [DISCONTINUED] Vitamin D, Ergocalciferol, (DRISDOL) 50000 units CAPS capsule Take 1 capsule (50,000 Units total) by mouth every 7 (seven) days.    No facility-administered encounter medications on file as of 12/06/2018.    Allergies  Allergen Reactions  . Amoxicillin-Pot Clavulanate Rash   ROS:  No fever, chills, headaches, dizziness, chest pain, shortness of breath, URI symptoms, bleeding/bruising,rash. +further intentional weight loss.  +tearful at times ("crybaby") but denies depression or anxiety.  Only occasionally has some trouble falling asleep.   Observations/Objective:  BP 133/84   Pulse 73   Ht 5\' 6"  (1.676 m)   Wt 250 lb (113.4 kg)   LMP 11/14/2018 (Exact Date)   BMI 40.35 kg/m   Wt Readings from Last 3 Encounters:  11/09/17 262 lb 6.4 oz (119 kg)  06/24/17 270 lb (122.5 kg)  02/24/17 289 lb 6.4 oz (131.3 kg)   Exam is limited due to virtual nature of the visit. Further weight loss (as checked on pt's scale at home) noted and pt congratulated. She is alert, oriented. She exhibits full range of affect, with some crying during the visit when discussing the stressors of the last few months. She has normal eye contact, speech, grooming. She is walking around a lot/pacing while on the video.  PHQ-9 score of 1 GAD-7 score of 4   Recent labs:  Vitamin D-OH 14.5 (low)    Chemistry      Component Value Date/Time   NA 138 12/03/2018 1135   K 4.1 12/03/2018 1135   CL 99 12/03/2018 1135   CO2 23 12/03/2018 1135   BUN 17 12/03/2018 1135   CREATININE 0.92 12/03/2018 1135   CREATININE 0.97 11/04/2016 0709      Component Value Date/Time   CALCIUM 9.4 12/03/2018 1135   ALKPHOS 80 12/03/2018 1135   AST 15 12/03/2018 1135   ALT 11 12/03/2018 1135   BILITOT 0.4 12/03/2018 1135     Fasting glucose 92  Lab Results   Component Value Date   CHOL 207 (H) 12/03/2018   HDL 74 12/03/2018   LDLCALC 121 (H) 12/03/2018   TRIG 60 12/03/2018   CHOLHDL 2.8 12/03/2018   Lab Results  Component Value Date   WBC 7.1 12/03/2018   HGB 15.2 12/03/2018   HCT 44.1 12/03/2018   MCV 101 (H) 12/03/2018   PLT 225 12/03/2018   Lab Results  Component Value Date   TSH 2.450 12/03/2018    Assessment and Plan:   Generalized anxiety disorder - Significant stressors in the last 6 months, more crying. Increase citalopram to 20mg . Encouraged further counseling when able. Infrequent panic attacks. - Plan: citalopram (CELEXA) 20 MG tablet  Vitamin D deficiency - noncompliant with vits. Tx with another 12 wks of 50K weekly--to use 5K daily until able to afford rx. Must take 2000 IU daily longterm upon completing rx  Tobacco use - encourage  cessation, setting quit date with her BF. Reviewed risks, and that mother died from smoking-related illness.    OSA (obstructive sleep apnea) - mild on prior sleep study.Has lost significant weight and no longer has symptoms. encouraged further weight loss   Increase citalopram to 20mg . Counseling and mindfulness technique encouraged/discussed.  rx vitamin D, need for 2000 IU long-term. Daily MVI also recommended.  Counseled re: smoking, encouraged to set quit date along with her boyfriend.  F/u as scheduled for physical in October, sooner prn.   Follow Up Instructions:    I discussed the assessment and treatment plan with the patient. The patient was provided an opportunity to ask questions and all were answered. The patient agreed with the plan and demonstrated an understanding of the instructions.   The patient was advised to call back or seek an in-person evaluation if the symptoms worsen or if the condition fails to improve as anticipated.  I provided 36 minutes of non-face-to-face time during this encounter.   Lavonda Jumbo, MD

## 2018-12-06 ENCOUNTER — Encounter: Payer: Self-pay | Admitting: Family Medicine

## 2018-12-06 ENCOUNTER — Ambulatory Visit (INDEPENDENT_AMBULATORY_CARE_PROVIDER_SITE_OTHER): Payer: Self-pay | Admitting: Family Medicine

## 2018-12-06 ENCOUNTER — Other Ambulatory Visit: Payer: Self-pay

## 2018-12-06 VITALS — BP 133/84 | HR 73 | Ht 66.0 in | Wt 250.0 lb

## 2018-12-06 DIAGNOSIS — E559 Vitamin D deficiency, unspecified: Secondary | ICD-10-CM

## 2018-12-06 DIAGNOSIS — G4733 Obstructive sleep apnea (adult) (pediatric): Secondary | ICD-10-CM

## 2018-12-06 DIAGNOSIS — Z720 Tobacco use: Secondary | ICD-10-CM

## 2018-12-06 DIAGNOSIS — F411 Generalized anxiety disorder: Secondary | ICD-10-CM

## 2018-12-06 MED ORDER — VITAMIN D (ERGOCALCIFEROL) 1.25 MG (50000 UNIT) PO CAPS: 50000.0000 [IU] | ORAL_CAPSULE | ORAL | 0 refills | Status: DC

## 2018-12-06 MED ORDER — CITALOPRAM HYDROBROMIDE 20 MG PO TABS: 20.0000 mg | ORAL_TABLET | Freq: Every day | ORAL | 0 refills | Status: DC

## 2018-12-06 NOTE — Patient Instructions (Signed)
I recommended increasing your citalopram dose to 20mg  (still a low dose) to help with the additional stressors right now, feeling overwhelmed, crying.  If/when stressors go back to "normal", you can cut back the dose to 10mg . I sent in a prescription for the 20mg  when you run out of doubling up on the 10 mg tablets that you already have.  Consider further counseling, when you are able to.  I also sent in a prescription for 50,000 IU of vitamin D, to be taken once a week. If this is too expensive to pick up (while you're waiting for the Medicaid issue to be resolved), take 5000 IU of OTC vitamin D daily until you can pick up the prescription. I recommend that you buy a multivitamin containing 1000 IU of D3, and to take this long-term. I also want you to pick up a large bottle of 2000 IU of vitamin D3 now.  You can take two of these plus your multivitamin until you are able to get the prescription D. If there is no delay in getting the prescription, start taking that once a week, and a daily multivitamin.  You can wait to start taking 2000 IU of the D3 until after you complete the prescription vitamin D.  We discussed the importance of having structure in your day. We discussed looking into mindfulness activities and exercise routines available through YouTube during this pandemic.  Please try and quit smoking--start thinking about why/when you smoke (habit, boredom, stress) in order to come up with effective strategies to cut back or quit. Available resources to help you quit include free counseling through [facility] ([facility] or 1-800-QUITNOW), smoking cessation classes through [facility] (call to find out schedule), over-the-counter nicotine replacements, and e-cigarettes (although this may not help break the hand-mouth habit).  Many insurance companies also have smoking cessation programs (which may decrease the cost of patches, meds if enrolled).  If these methods are  not effective for you, and you are motivated to quit, return to discuss the possibility of prescription medications. Setting a quit date along with your boyfriend should help you both be successful in quitting, with you guys supporting each other.   Mindfulness-Based Stress Reduction Mindfulness-based stress reduction (MBSR) is a program that helps people learn to practice mindfulness. Mindfulness is the practice of intentionally paying attention to the present moment. It can be learned and practiced through techniques such as education, breathing exercises, meditation, and yoga. MBSR includes several mindfulness techniques in one program. MBSR works best when you understand the treatment, are willing to try new things, and can commit to spending time practicing what you learn. MBSR training may include learning about:  How your emotions, thoughts, and reactions affect your body.  New ways to respond to things that cause negative thoughts to start (triggers).  How to notice your thoughts and let go of them.  Practicing awareness of everyday things that you normally do without thinking.  The techniques and goals of different types of meditation. What are the benefits of MBSR? MBSR can have many benefits, which include helping you to:  Develop self-awareness. This refers to knowing and understanding yourself.  Learn skills and attitudes that help you to participate in your own health care.  Learn new ways to care for yourself.  Be more accepting about how things are, and let things go.  Be less judgmental and approach things with an open mind.  Be patient with yourself and trust yourself more. MBSR  has also been shown to:  Reduce negative emotions, such as depression and anxiety.  Improve memory and focus.  Change how you sense and approach pain.  Boost your body's ability to fight infections.  Help you connect better with other people.  Improve your sense of well-being.  Follow these instructions at home:   Find a local in-person or online MBSR program.  Set aside some time regularly for mindfulness practice.  Find a mindfulness practice that works best for you. This may include one or more of the following: ? Meditation. Meditation involves focusing your mind on a certain thought or activity. ? Breathing awareness exercises. These help you to stay present by focusing on your breath. ? Body scan. For this practice, you lie down and pay attention to each part of your body from head to toe. You can identify tension and soreness and intentionally relax parts of your body. ? Yoga. Yoga involves stretching and breathing, and it can improve your ability to move and be flexible. It can also provide an experience of testing your body's limits, which can help you release stress. ? Mindful eating. This way of eating involves focusing on the taste, texture, color, and smell of each bite of food. Because this slows down eating and helps you feel full sooner, it can be an important part of a weight-loss plan.  Find a podcast or recording that provides guidance for breathing awareness, body scan, or meditation exercises. You can listen to these any time when you have a free moment to rest without distractions.  Follow your treatment plan as told by your health care provider. This may include taking regular medicines and making changes to your diet or lifestyle as recommended. How to practice mindfulness To do a basic awareness exercise:  Find a comfortable place to sit.  Pay attention to the present moment. Observe your thoughts, feelings, and surroundings just as they are.  Avoid placing judgment on yourself, your feelings, or your surroundings. Make note of any judgment that comes up, and let it go.  Your mind may wander, and that is okay. Make note of when your thoughts drift, and return your attention to the present moment. To do basic mindfulness meditation:   Find a comfortable place to sit. This may include a stable chair or a firm floor cushion. ? Sit upright with your back straight. Let your arms fall next to your side with your hands resting on your legs. ? If sitting in a chair, rest your feet flat on the floor. ? If sitting on a cushion, cross your legs in front of you.  Keep your head in a neutral position with your chin dropped slightly. Relax your jaw and rest the tip of your tongue on the roof of your mouth. Drop your gaze to the floor. You can close your eyes if you like.  Breathe normally and pay attention to your breath. Feel the air moving in and out of your nose. Feel your belly expanding and relaxing with each breath.  Your mind may wander, and that is okay. Make note of when your thoughts drift, and return your attention to your breath.  Avoid placing judgment on yourself, your feelings, or your surroundings. Make note of any judgment or feelings that come up, let them go, and bring your attention back to your breath.  When you are ready, lift your gaze or open your eyes. Pay attention to how your body feels after the meditation. Where to find more  information You can find more information about MBSR from:  Your health care provider.  Community-based meditation centers or programs.  Programs offered near you. Summary  Mindfulness-based stress reduction (MBSR) is a program that teaches you how to intentionally pay attention to the present moment. It is used with other treatments to help you cope better with daily stress, emotions, and pain.  MBSR focuses on developing self-awareness, which allows you to respond to life stress without judgment or negative emotions.  MBSR programs may involve learning different mindfulness practices, such as breathing exercises, meditation, yoga, body scan, or mindful eating. Find a mindfulness practice that works best for you, and set aside time for it on a regular basis. This information is  not intended to replace advice given to you by your health care provider. Make sure you discuss any questions you have with your health care provider. Document Released: 12/04/2016 Document Revised: 12/04/2016 Document Reviewed: 12/04/2016 Elsevier Interactive Patient Education  2019 [employer].

## 2018-12-09 ENCOUNTER — Encounter: Payer: Self-pay | Admitting: Family Medicine

## 2019-02-11 ENCOUNTER — Other Ambulatory Visit: Payer: Self-pay | Admitting: Family Medicine

## 2019-02-11 DIAGNOSIS — F411 Generalized anxiety disorder: Secondary | ICD-10-CM

## 2019-05-29 ENCOUNTER — Encounter: Payer: Self-pay | Admitting: Family Medicine

## 2019-05-29 DIAGNOSIS — F411 Generalized anxiety disorder: Secondary | ICD-10-CM

## 2019-05-30 MED ORDER — CITALOPRAM HYDROBROMIDE 10 MG PO TABS: 20.0000 mg | ORAL_TABLET | Freq: Every day | ORAL | 0 refills | Status: DC

## 2019-06-09 ENCOUNTER — Encounter: Payer: Self-pay | Admitting: Family Medicine

## 2019-10-13 ENCOUNTER — Telehealth: Payer: Self-pay

## 2019-10-13 ENCOUNTER — Telehealth: Payer: Self-pay | Admitting: Family Medicine

## 2019-10-13 DIAGNOSIS — F411 Generalized anxiety disorder: Secondary | ICD-10-CM

## 2019-10-13 MED ORDER — CITALOPRAM HYDROBROMIDE 10 MG PO TABS: 10.0000 mg | ORAL_TABLET | Freq: Every day | ORAL | 0 refills | Status: DC

## 2019-10-13 NOTE — Telephone Encounter (Signed)
I replied to your staff message before I saw this message.  Please document here, and send this message back with whatever the plan is, so that I can figure out what to do about the refill request (that I didn't realize was associated with the request for appt)

## 2019-10-13 NOTE — Telephone Encounter (Signed)
[facility] req refill Citalopram 10 mg #90  Patient did not show for 05/2019 cpe  Called pt today to schedule cpe no answer, left message

## 2019-10-13 NOTE — Telephone Encounter (Signed)
Received a fax from [facility] [address] [place] for a refill on her Citalopram for a 90 day supply pt.last apt was 12/06/18 and has an apt. Coming up on 10/19/19.

## 2019-10-13 NOTE — Telephone Encounter (Signed)
Done. Patient only taking 10mg  and requested #90 as it is only $10.

## 2019-10-13 NOTE — Telephone Encounter (Signed)
Send staff message

## 2019-10-13 NOTE — Telephone Encounter (Signed)
Already addressed

## 2019-10-13 NOTE — Telephone Encounter (Signed)
Pt called and a medcheck was scheduled for next week. She only has four pills left of celexa. please send to [facility] in [place].

## 2019-10-13 NOTE — Telephone Encounter (Signed)
Pt scheduled for a virtual medcheck next week.

## 2019-10-18 NOTE — Progress Notes (Signed)
Start time: 3:43 End time: 4:21  Virtual Visit via Video Note  I connected with [name] Silva on 10/19/2019 by a video enabled telemedicine application and verified that I am speaking with the correct person using two identifiers.  Location: Patient: home Provider: office   I discussed the limitations of evaluation and management by telemedicine and the availability of in person appointments. The patient expressed understanding and agreed to proceed.  History of Present Illness: Chief Complaint  Patient presents with  . Anxiety    VIRTUAL med check. No new concerns.     Patient presents to f/u on anxiety. She still doesn't have insurance, so preferred to hold off on scheduling CPE. She was needing refill of citalopram, hadn't been seen since 11/2018, so was asked to schedule med check and pt preferred to do virtually. (med was refilled for her prior to visit).  Anxiety: She continues on citalopram.She needs to use an alprazolam about once every 1-2 weeks, and is requesting a refill.Alprazolam last filled for #30 in 02/2019. Her citalopram dose was increased to 20mg  after her visit in 11/2018 when she was crying more, had significant stressors/life changes as described below. She admits that she only took this higher dose for about a month, and then went back to 10mg . She feels like she is overall doing well.  She still has stressors, they are just different, but feels like she can cope with them. She feels like the current dose is working fine, has some bad days, but deals with things okay.  At her last visit we had reviewed stressors/life changes, which included: Her mother died in 08-15-2018(laryngeal, metastatic). She had gotten some grief counseling through [facility] (Dr. ). Her mother left her $40,000 in cash, but it was stolen. Her boyfriend had variceal bleeding on 1/13, was in ICU for a week.  She reports she lost her job in 08/2018 due to missed work (caring for  him, time off for her mother, needing time for her daughter's appointments). Her daughter was having difficulty with homeschooling.  She has anxiety, ADD, meds were getting adjusted, had changed psychiatrists. Her boyfriend's health had since improved.  He had been sober for over 100 days at the time of our last visit.  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 MIL's health has been bad, but currently doing better (hospitalized twice in the last few weeks). Boyfriend was sober x 6 months, but they found his best friend dead (quit drinking cold 09/2018, had died in his sleep), and boyfriend relapsed, but sober now again x 2 weeks. Working on [name] Silva. 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 Gets SSI check from her husband, paying for school, expenses..  Tobacco use: She is smoking about 1 PPD (when she is in school and busy), up to 1.5 PPD some days.   Her boyfriend still smoking. She reports he has COPD and needs to quit.  She is somewhat afraid to quit as she will gain more weight. She really hadn't noticed her weight gain, as she has been wearing leggings, but got on the scale today. One other time put on jeans which were tight but blamed it on the dryer.  Vitamin D deficiency:Lastlevel waslow of 14.5 in 11/2018.  Prior value was 16.6 in 11/2017. She had not been  taking any vitamin supplements when labs were checked. In 11/2018 we recommended treating with another 12 wks of 50K weekly--to use 5K daily until able to afford rx. She was told to take 2000 IU daily longterm upon completing rx.  She reports she did fill the prescription and complete it, but admits she hasn't been taking any vitamins recently at all. She has women's Centrum at home, but hasn't been taking, only sporadically.  OSA: She  had sleep study done4/2017. This showed mild OSA, mild oxygen desaturation (86%). Her weight at the time of the study was 283#, and she has continued to lose weight. She denies unrefreshed sleep or daytime somnolence.  If she gets up out of bed when she wakes up, she is fine and feels fine al day, but if she has the opportunity, she reports she could spend all day in bed. +snoring per boyfriend, not aware of stopping breathing, but snoring is sometimes louder than others.  Obesity:She had lost a lot of weight by eating healthier, getting regular exercise. She had been going to the gym prior to them closing related to [place].   Not getting much regular exercise now, and not eating healthy, more snacking, mindless eating.   PMH, PSH, SH reviewed  Outpatient Encounter Medications as of 10/19/2019  Medication Sig  . ALPRAZolam (XANAX) 0.5 MG tablet TAKE 1/2 TO 1 TABLET BY MOUTH THREE TIMES DAILY AS NEEDED  . citalopram (CELEXA) 10 MG tablet Take 1 tablet (10 mg total) by mouth daily.   No facility-administered encounter medications on file as of 10/19/2019.   Allergies  Allergen Reactions  . Amoxicillin-Pot Clavulanate Rash   ROS: no fever, chills, URI symptoms, dizziness, chest pain, fatigue, shortness of breath. Anxiety per HPI.    Observations/Objective: BP 135/88   Pulse 77   Ht 5\' 6"  (1.676 m)   Wt 273 lb (123.8 kg)   LMP 09/11/2019   BMI 44.06 kg/m   Wt Readings from Last 3 Encounters:  10/19/19 273 lb (123.8 kg)  12/06/18 250 lb (113.4 kg)  11/09/17 262 lb 6.4 oz (119 kg)   Pleasant, well-appearing female, in good spirits, in no distress. Normal speech, eye contact, grooming. Cranial nerves grossly intact, alert and oriented. Exam limited due to virtual nature of the visit.  Assessment and Plan:   Generalized anxiety disorder - controlled, cont citlopram 10mg . alprazolam prn panic attacks - Plan: ALPRAZolam (XANAX) 0.5 MG tablet  Vitamin D deficiency - suspect  will be very low again, noncompliant with vitamins. To start D3 2000 IU daily.  Tobacco use - risks reviewed, encouraged cessation--if not for her, then to help her BF, with COPD who needs to quit  OSA (obstructive sleep apnea) - Discussed risks of OSA, and encouraged her to lose weight.  Contact us if unrefreshed sleep or daytime somnolence develops  Obesity, morbid, BMI 40.0-49.9 ([place]) - counseled at length regarding exercise, healthy diet, portions, mindful eating, and risks of obesity.       COVID facc  Mindful eating  Follow Up Instructions:   I discussed the assessment and treatment plan with the patient. The patient was provided an opportunity to ask questions and all were answered. The patient agreed with the plan and demonstrated an understanding of the instructions.   The patient was advised to call back or seek an in-person evaluation if the symptoms worsen or if the condition fails to improve as anticipated.  I provided 38 minutes of non-face-to-face time during this encounter.  Vikki Ports, MD

## 2019-10-19 ENCOUNTER — Other Ambulatory Visit: Payer: Self-pay

## 2019-10-19 ENCOUNTER — Ambulatory Visit (INDEPENDENT_AMBULATORY_CARE_PROVIDER_SITE_OTHER): Payer: Self-pay | Admitting: Family Medicine

## 2019-10-19 ENCOUNTER — Encounter: Payer: Self-pay | Admitting: Family Medicine

## 2019-10-19 VITALS — BP 135/88 | HR 77 | Ht 66.0 in | Wt 273.0 lb

## 2019-10-19 DIAGNOSIS — E559 Vitamin D deficiency, unspecified: Secondary | ICD-10-CM

## 2019-10-19 DIAGNOSIS — F411 Generalized anxiety disorder: Secondary | ICD-10-CM

## 2019-10-19 DIAGNOSIS — G4733 Obstructive sleep apnea (adult) (pediatric): Secondary | ICD-10-CM

## 2019-10-19 DIAGNOSIS — Z720 Tobacco use: Secondary | ICD-10-CM

## 2019-10-19 MED ORDER — ALPRAZOLAM 0.5 MG PO TABS: 0.2500 mg | ORAL_TABLET | Freq: Three times a day (TID) | ORAL | 0 refills | Status: DC | PRN

## 2019-10-19 NOTE — Patient Instructions (Signed)
Start taking 2000 IU of Vitamin D3 every day. You can take this along with your other medications (citalopram).  If you miss a day, you can double up the next day. You may also take your daily multivitamin along with it.  We discussed quitting smoking--if not for yourself and your health benefits (to reduce cardiovascular risks--stroke, heart attack, circulatory problems, mouth/throat/lung/bladder cancer, emphysema, etc), then to help your boyfriend who already has COPD and other health risks, for which he should quit. You can help support him, just as you do with his sobriety.  We discussed pairing your quitting smoking with an exercise routine, to try and minimize any weight gain.  It is important that you work hard on trying to lose the weight that you regained since your last visit.  It is hard to notice when wearing leggings, so periodically put on those jeans, and get on the scale once a week to help hold you accountable.  We discussed making small changes (one thing/week, rather than trying to do everything we discussed all at once). I encourage you to cut out caloric drinks that have no nutritional value (ie regular sodas, sweet tea). I encourage you to eat mindfully--do not eat while watching TV, doing homework.  Enjoy each bite and recognize when you feel full.  Never eat out of the bag--always put a serving size in a bowl, so that you are aware of how much you are eating. Try and limit any snacking or eating after 8pm. Cut back on carbs---smaller portion of rice, pasta, breads, potatoes, and eat more vegetables, and ensure getting enough protein in your diet (which helps you from getting too hungry).  Try and get at least 30 minutes of exercise daily--even if just 15 minutes twice a day.  You can go for walks, do some videos (check out YouTube), or even just blast music and dance around the house!)  You can use the gym for weight training (less uncomfortable to wear the mask while using the  weight machines than the cardio machines).  Walk outside where you don't need to wear a mask.  I highly encourage you to get the COVID vaccine.  You and your boyfriend both have risk factors which could lead to more serious illness if you were to contract the virus.  I think this should sum up some of the things that we spoke about at today's video visit.  Let me know if there is anything else I can do, to help you reach some of these goals (weight loss, quitting smoking, and keeping your moods controlled), or with anything else.  Stay well!

## 2020-01-12 ENCOUNTER — Encounter: Payer: Self-pay | Admitting: Family Medicine

## 2020-02-03 ENCOUNTER — Other Ambulatory Visit: Payer: Self-pay | Admitting: Family Medicine

## 2020-02-03 DIAGNOSIS — F411 Generalized anxiety disorder: Secondary | ICD-10-CM

## 2020-02-05 ENCOUNTER — Other Ambulatory Visit: Payer: Self-pay

## 2020-02-05 ENCOUNTER — Ambulatory Visit: Payer: Self-pay

## 2020-02-05 ENCOUNTER — Encounter (HOSPITAL_COMMUNITY): Payer: Self-pay

## 2020-02-05 ENCOUNTER — Ambulatory Visit (HOSPITAL_COMMUNITY)
Admission: RE | Admit: 2020-02-05 | Discharge: 2020-02-05 | Disposition: A | Discharge status: Home / Self Care | Payer: 59 | Source: Ambulatory Visit | Attending: Urgent Care | Admitting: Urgent Care

## 2020-02-05 VITALS — BP 159/98 | HR 94 | Temp 98.0°F | Resp 16

## 2020-02-05 DIAGNOSIS — M5416 Radiculopathy, lumbar region: Secondary | ICD-10-CM

## 2020-02-05 DIAGNOSIS — M545 Low back pain, unspecified: Secondary | ICD-10-CM

## 2020-02-05 MED ORDER — TIZANIDINE HCL 4 MG PO TABS: 4.0000 mg | ORAL_TABLET | Freq: Three times a day (TID) | ORAL | 0 refills | Status: DC | PRN

## 2020-02-05 MED ORDER — METHYLPREDNISOLONE ACETATE 80 MG/ML IJ SUSP
80.0000 mg | Freq: Once | INTRAMUSCULAR | Status: AC
  Administered 2020-02-05: 80 mg via INTRAMUSCULAR

## 2020-02-05 MED ORDER — METHYLPREDNISOLONE ACETATE 80 MG/ML IJ SUSP
INTRAMUSCULAR | Status: AC
  Filled 2020-02-05: qty 1

## 2020-02-05 NOTE — ED Provider Notes (Signed)
[facility]   MRN: [id number] DOB: 1975/10/16  Subjective:   [name] Silva is a 44 y.o. female presenting for acute onset of left-sided low back pain that radiates to her left thigh, has burning and tingling sensation.  Symptoms started after she bent down yesterday and have only worsened.  She has used Flexeril and ibuprofen without any relief.  Had a very difficult time falling asleep.  Denies history of degenerative disc disease, bulging disc, back issues.  No current facility-administered medications for this encounter.  Current Outpatient Medications:  .  ALPRAZolam (XANAX) 0.5 MG tablet, Take 0.5-1 tablets (0.25-0.5 mg total) by mouth 3 (three) times daily as needed., Disp: 30 tablet, Rfl: 0 .  citalopram (CELEXA) 10 MG tablet, Take 1 tablet (10 mg total) by mouth daily., Disp: 90 tablet, Rfl: 0 .  tiZANidine (ZANAFLEX) 4 MG tablet, Take 1 tablet (4 mg total) by mouth every 8 (eight) hours as needed., Disp: 30 tablet, Rfl: 0   Allergies  Allergen Reactions  . Amoxicillin-Pot Clavulanate Rash    Past Medical History:  Diagnosis Date  . Anxiety   . Carpal tunnel syndrome of left wrist 06/2016  . Dental crown present   . Obesity   . Sleep apnea    no CPAP use, mild  . Smoker   . Wears glasses      Past Surgical History:  Procedure Laterality Date  . BILATERAL SALPINGECTOMY Bilateral 01/09/2017   Procedure: LAPAROSCOPIC SALPINGECTOMY;  Surgeon: Lavina Hamman, MD;  Location: [facility];  Service: Gynecology;  Laterality: Bilateral;  . CARPAL TUNNEL RELEASE Left 07/08/2016   Procedure: CARPAL TUNNEL RELEASE, left;  Surgeon: Cindee Salt, MD;  Location: [facility];  Service: Orthopedics;  Laterality: Left;  FAB  . CARPAL TUNNEL RELEASE Right 07/10/2017  . CESAREAN SECTION  04/10/2006    Family History  Problem Relation Age of Onset  . COPD Mother   . Lumbar disc disease Mother   . Cancer Mother        cervical cancer; laryngeal CA with mets   .  Anxiety disorder Mother   . Depression Mother   . Diabetes Father   . Hypertension Father   . Hyperlipidemia Father   . COPD Maternal Uncle        lung  . Cancer Maternal Uncle        lung cancer  . Stroke Maternal Grandmother   . Heart disease Maternal Grandfather     Social History   Tobacco Use  . Smoking status: Current Every Day Smoker    Packs/day: 1.00    Years: 20.00    Pack years: 20.00    Types: Cigarettes  . Smokeless tobacco: Never Used  Vaping Use  . Vaping Use: Former  Substance Use Topics  . Alcohol use: Yes    Alcohol/week: 1.0 standard drink    Types: 1 Standard drinks or equivalent per week    Comment: socially, a few drinks/month  . Drug use: No    ROS   Objective:   Vitals: BP (!) 159/98 (BP Location: Right Arm)   Pulse 94   Temp 98 F (36.7 C) (Oral)   Resp 16   LMP 02/05/2020 (Exact Date)   SpO2 97%   Physical Exam Constitutional:      General: She is not in acute distress.    Appearance: Normal appearance. She is well-developed. She is not ill-appearing.  HENT:     Head: Normocephalic and atraumatic.  Nose: Nose normal.     Mouth/Throat:     Mouth: Mucous membranes are moist.     Pharynx: Oropharynx is clear.  Eyes:     General: No scleral icterus.    Extraocular Movements: Extraocular movements intact.     Pupils: Pupils are equal, round, and reactive to light.  Cardiovascular:     Rate and Rhythm: Normal rate.  Pulmonary:     Effort: Pulmonary effort is normal.  Musculoskeletal:     Lumbar back: Spasms and tenderness (Over area outlined) present. No swelling, edema, deformity, signs of trauma, lacerations or bony tenderness. Decreased range of motion. Negative right straight leg raise test and negative left straight leg raise test. No scoliosis.       Back:  Skin:    General: Skin is warm and dry.  Neurological:     General: No focal deficit present.     Mental Status: She is alert and oriented to person, place, and  time.     Motor: No weakness.     Coordination: Coordination normal.     Gait: Gait normal.     Deep Tendon Reflexes: Reflexes normal.  Psychiatric:        Mood and Affect: Mood normal.        Behavior: Behavior normal.        Thought Content: Thought content normal.        Judgment: Judgment normal.      Assessment and Plan :   PDMP not reviewed this encounter.  1. Acute left-sided low back pain without sciatica   2. Lumbar radiculopathy     Suspect strain versus radiculopathy. Patient requested aggressive management as she is not responding to NSAID at home. IM depomedrol in clinic, tizanidine, hydration, rest, back care at home. Counseled patient on potential for adverse effects with medications prescribed/recommended today, ER and return-to-clinic precautions discussed, patient verbalized understanding.    Jaynee Eagles, [place] 02/05/20 1821

## 2020-02-05 NOTE — ED Triage Notes (Addendum)
Pt presents today for back pain that radiates to right leg. Pt states she bent down yesterday and has pain ever since. Pt has been treating with flexeril, ibuprofen with out relief. Pt took 400mg  ibuprofen at 1600. Pt denies other relieving factors. Pt ambulated into treatment space unassisted. Pt  Describes pain as burning, and unable to find position of comfort. Pt denies obvious trauma or injury to back and leg.

## 2020-02-06 NOTE — Telephone Encounter (Signed)
Is this okay to refill? 

## 2020-03-14 ENCOUNTER — Other Ambulatory Visit: Payer: Self-pay | Admitting: Family Medicine

## 2020-03-14 DIAGNOSIS — F411 Generalized anxiety disorder: Secondary | ICD-10-CM

## 2020-03-14 NOTE — Telephone Encounter (Signed)
Is this okay to refill?. Dr. Knapp pt 

## 2020-03-14 NOTE — Telephone Encounter (Signed)
Is this okay to refill? 
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
## 2020-04-24 NOTE — Patient Instructions (Addendum)
  HEALTH MAINTENANCE RECOMMENDATIONS:  It is recommended that you get at least 30 minutes of aerobic exercise at least 5 days/week (for weight loss, you may need as much as 60-90 minutes). This can be any activity that gets your heart rate up. This can be divided in 10-15 minute intervals if needed, but try and build up your endurance at least once a week.  Weight bearing exercise is also recommended twice weekly.  Eat a healthy diet with lots of vegetables, fruits and fiber.  "Colorful" foods have a lot of vitamins (ie green vegetables, tomatoes, red peppers, etc).  Limit sweet tea, regular sodas and alcoholic beverages, all of which has a lot of calories and sugar.  Up to 1 alcoholic drink daily may be beneficial for women (unless trying to lose weight, watch sugars).  Drink a lot of water.  Calcium recommendations are 1200-1500 mg daily (1500 mg for postmenopausal women or women without ovaries), and vitamin D 1000 IU daily.  This should be obtained from diet and/or supplements (vitamins), and calcium should not be taken all at once, but in divided doses.  Monthly self breast exams and yearly mammograms for women over the age of 54 is recommended.  Sunscreen of at least SPF 30 should be used on all sun-exposed parts of the skin when outside between the hours of 10 am and 4 pm (not just when at beach or pool, but even with exercise, golf, tennis, and yard work!)  Use a sunscreen that says "broad spectrum" so it covers both UVA and UVB rays, and make sure to reapply every 1-2 hours.  Remember to change the batteries in your smoke detectors when changing your clock times in the spring and fall. Carbon monoxide detectors are recommended for your home.  Use your seat belt every time you are in a car, and please drive safely and not be distracted with cell phones and texting while driving.  You are past due for mammogram. Please call the [facility] and schedule 3D mammogram.  Please try and quit  smoking--start thinking about why/when you smoke (habit, boredom, stress) in order to come up with effective strategies to cut back or quit. Available resources to help you quit include free counseling through [facility] ([place]uit[facility] or 1-800-QUITNOW), smoking cessation classes through [facility] (call to find out schedule), over-the-counter nicotine replacements, and e-cigarettes (although this may not help break the hand-mouth habit).  Many insurance companies also have smoking cessation programs (which may decrease the cost of patches, meds if enrolled).  If these methods are not effective for you, and you are motivated to quit, return to discuss the possibility of prescription medications.  Tetanus wlll be due for a booster next year. Flu shots and COVID vaccine are highly recommended.  Please return for these if you change your mind.

## 2020-04-25 ENCOUNTER — Other Ambulatory Visit: Payer: Self-pay

## 2020-04-25 ENCOUNTER — Ambulatory Visit: Payer: 59 | Admitting: Family Medicine

## 2020-04-25 ENCOUNTER — Encounter: Payer: Self-pay | Admitting: Family Medicine

## 2020-04-25 VITALS — BP 130/74 | HR 76 | Ht 65.5 in | Wt 279.6 lb

## 2020-04-25 DIAGNOSIS — Z0000 Encounter for general adult medical examination without abnormal findings: Secondary | ICD-10-CM | POA: Diagnosis not present

## 2020-04-25 DIAGNOSIS — G4733 Obstructive sleep apnea (adult) (pediatric): Secondary | ICD-10-CM | POA: Diagnosis not present

## 2020-04-25 DIAGNOSIS — Z7189 Other specified counseling: Secondary | ICD-10-CM

## 2020-04-25 DIAGNOSIS — E559 Vitamin D deficiency, unspecified: Secondary | ICD-10-CM | POA: Diagnosis not present

## 2020-04-25 DIAGNOSIS — F411 Generalized anxiety disorder: Secondary | ICD-10-CM | POA: Diagnosis not present

## 2020-04-25 DIAGNOSIS — Z7185 Encounter for immunization safety counseling: Secondary | ICD-10-CM

## 2020-04-25 DIAGNOSIS — Z720 Tobacco use: Secondary | ICD-10-CM

## 2020-04-25 DIAGNOSIS — R635 Abnormal weight gain: Secondary | ICD-10-CM

## 2020-04-25 LAB — POCT URINALYSIS DIP (PROADVANTAGE DEVICE)
Bilirubin, UA: NEGATIVE
Blood, UA: NEGATIVE
Glucose, UA: NEGATIVE mg/dL
Ketones, POC UA: NEGATIVE mg/dL
Leukocytes, UA: NEGATIVE
Nitrite, UA: NEGATIVE
Protein Ur, POC: NEGATIVE mg/dL
Specific Gravity, Urine: 1.02
Urobilinogen, Ur: NEGATIVE
pH, UA: 7 (ref 5.0–8.0)

## 2020-04-26 MED ORDER — MUPIROCIN 2 % EX OINT: 1.0000 "application " | TOPICAL_OINTMENT | Freq: Two times a day (BID) | CUTANEOUS | 0 refills | Status: DC | PRN

## 2020-05-02 ENCOUNTER — Other Ambulatory Visit: Payer: 59

## 2020-05-09 ENCOUNTER — Other Ambulatory Visit: Payer: 59

## 2020-05-09 DIAGNOSIS — E559 Vitamin D deficiency, unspecified: Secondary | ICD-10-CM

## 2020-05-09 DIAGNOSIS — R635 Abnormal weight gain: Secondary | ICD-10-CM

## 2020-05-09 DIAGNOSIS — Z0000 Encounter for general adult medical examination without abnormal findings: Secondary | ICD-10-CM

## 2020-05-10 LAB — COMPREHENSIVE METABOLIC PANEL
ALT: 13 IU/L (ref 0–32)
AST: 15 IU/L (ref 0–40)
Albumin/Globulin Ratio: 2 (ref 1.2–2.2)
Albumin: 4.4 g/dL (ref 3.8–4.8)
Alkaline Phosphatase: 99 IU/L (ref 44–121)
BUN/Creatinine Ratio: 15 (ref 9–23)
BUN: 14 mg/dL (ref 6–24)
Bilirubin Total: 0.4 mg/dL (ref 0.0–1.2)
CO2: 24 mmol/L (ref 20–29)
Calcium: 9.3 mg/dL (ref 8.7–10.2)
Chloride: 102 mmol/L (ref 96–106)
Creatinine, Ser: 0.95 mg/dL (ref 0.57–1.00)
GFR calc Af Amer: 85 mL/min/{1.73_m2} (ref >59)
GFR calc non Af Amer: 74 mL/min/{1.73_m2} (ref >59)
Globulin, Total: 2.2 g/dL (ref 1.5–4.5)
Glucose: 92 mg/dL (ref 65–99)
Potassium: 4.6 mmol/L (ref 3.5–5.2)
Sodium: 140 mmol/L (ref 134–144)
Total Protein: 6.6 g/dL (ref 6.0–8.5)

## 2020-05-10 LAB — LIPID PANEL
Chol/HDL Ratio: 3.7 ratio (ref 0.0–4.4)
Cholesterol, Total: 217 mg/dL — ABNORMAL HIGH (ref 100–199)
HDL: 58 mg/dL (ref >39)
LDL Chol Calc (NIH): 146 mg/dL — ABNORMAL HIGH (ref 0–99)
Triglycerides: 72 mg/dL (ref 0–149)
VLDL Cholesterol Cal: 13 mg/dL (ref 5–40)

## 2020-05-10 LAB — CBC WITH DIFFERENTIAL/PLATELET
Basophils Absolute: 0 10*3/uL (ref 0.0–0.2)
Basos: 1 %
EOS (ABSOLUTE): 0.2 10*3/uL (ref 0.0–0.4)
Eos: 3 %
Hematocrit: 43.1 % (ref 34.0–46.6)
Hemoglobin: 14.6 g/dL (ref 11.1–15.9)
Immature Grans (Abs): 0 10*3/uL (ref 0.0–0.1)
Immature Granulocytes: 0 %
Lymphocytes Absolute: 2.2 10*3/uL (ref 0.7–3.1)
Lymphs: 34 %
MCH: 35.2 pg — ABNORMAL HIGH (ref 26.6–33.0)
MCHC: 33.9 g/dL (ref 31.5–35.7)
MCV: 104 fL — ABNORMAL HIGH (ref 79–97)
Monocytes Absolute: 0.4 10*3/uL (ref 0.1–0.9)
Monocytes: 7 %
Neutrophils Absolute: 3.5 10*3/uL (ref 1.4–7.0)
Neutrophils: 55 %
Platelets: 251 10*3/uL (ref 150–450)
RBC: 4.15 x10E6/uL (ref 3.77–5.28)
RDW: 12.7 % (ref 11.7–15.4)
WBC: 6.3 10*3/uL (ref 3.4–10.8)

## 2020-05-10 LAB — TSH: TSH: 3.16 u[IU]/mL (ref 0.450–4.500)

## 2020-05-10 LAB — VITAMIN D 25 HYDROXY (VIT D DEFICIENCY, FRACTURES): Vit D, 25-Hydroxy: 26 ng/mL — ABNORMAL LOW (ref 30.0–100.0)

## 2020-05-16 ENCOUNTER — Other Ambulatory Visit: Payer: Self-pay | Admitting: Family Medicine

## 2020-05-16 DIAGNOSIS — F411 Generalized anxiety disorder: Secondary | ICD-10-CM

## 2020-09-05 ENCOUNTER — Other Ambulatory Visit: Payer: Self-pay | Admitting: Medical

## 2020-09-05 DIAGNOSIS — F411 Generalized anxiety disorder: Secondary | ICD-10-CM

## 2020-09-05 NOTE — Telephone Encounter (Signed)
Is this okay to refill? 

## 2020-11-22 ENCOUNTER — Encounter: Payer: Self-pay | Admitting: Family Medicine

## 2020-11-23 ENCOUNTER — Other Ambulatory Visit: Payer: Self-pay

## 2020-11-23 ENCOUNTER — Encounter (HOSPITAL_COMMUNITY): Payer: Self-pay | Admitting: Pharmacy Technician

## 2020-11-23 ENCOUNTER — Encounter (HOSPITAL_BASED_OUTPATIENT_CLINIC_OR_DEPARTMENT_OTHER): Payer: Self-pay

## 2020-11-23 ENCOUNTER — Other Ambulatory Visit: Payer: Self-pay | Admitting: Family Medicine

## 2020-11-23 ENCOUNTER — Emergency Department (HOSPITAL_COMMUNITY)
Admission: EM | Admit: 2020-11-23 | Discharge: 2020-11-23 | Disposition: A | Discharge status: Home / Self Care | Payer: Self-pay | Attending: Emergency Medicine | Admitting: Emergency Medicine

## 2020-11-23 ENCOUNTER — Ambulatory Visit (HOSPITAL_COMMUNITY)
Admission: EM | Admit: 2020-11-23 | Discharge: 2020-11-23 | Disposition: A | Discharge status: Home / Self Care | Payer: Self-pay

## 2020-11-23 ENCOUNTER — Emergency Department (HOSPITAL_BASED_OUTPATIENT_CLINIC_OR_DEPARTMENT_OTHER)
Admission: EM | Admit: 2020-11-23 | Discharge: 2020-11-23 | Disposition: A | Discharge status: Home / Self Care | Payer: Self-pay | Attending: Emergency Medicine | Admitting: Emergency Medicine

## 2020-11-23 DIAGNOSIS — G44209 Tension-type headache, unspecified, not intractable: Secondary | ICD-10-CM | POA: Insufficient documentation

## 2020-11-23 DIAGNOSIS — F17210 Nicotine dependence, cigarettes, uncomplicated: Secondary | ICD-10-CM | POA: Insufficient documentation

## 2020-11-23 DIAGNOSIS — Z79899 Other long term (current) drug therapy: Secondary | ICD-10-CM | POA: Insufficient documentation

## 2020-11-23 DIAGNOSIS — I10 Essential (primary) hypertension: Secondary | ICD-10-CM | POA: Insufficient documentation

## 2020-11-23 DIAGNOSIS — R030 Elevated blood-pressure reading, without diagnosis of hypertension: Secondary | ICD-10-CM | POA: Insufficient documentation

## 2020-11-23 DIAGNOSIS — F411 Generalized anxiety disorder: Secondary | ICD-10-CM

## 2020-11-23 LAB — BASIC METABOLIC PANEL
Anion gap: 10 (ref 5–15)
BUN: 13 mg/dL (ref 6–20)
CO2: 26 mmol/L (ref 22–32)
Calcium: 9.7 mg/dL (ref 8.9–10.3)
Chloride: 100 mmol/L (ref 98–111)
Creatinine, Ser: 0.86 mg/dL (ref 0.44–1.00)
GFR, Estimated: >60 mL/min (ref >60)
Glucose, Bld: 124 mg/dL — ABNORMAL HIGH (ref 70–99)
Potassium: 3.5 mmol/L (ref 3.5–5.1)
Sodium: 136 mmol/L (ref 135–145)

## 2020-11-23 LAB — CBC
HCT: 48.2 % — ABNORMAL HIGH (ref 36.0–46.0)
Hemoglobin: 16.2 g/dL — ABNORMAL HIGH (ref 12.0–15.0)
MCH: 36.3 pg — ABNORMAL HIGH (ref 26.0–34.0)
MCHC: 33.6 g/dL (ref 30.0–36.0)
MCV: 108.1 fL — ABNORMAL HIGH (ref 80.0–100.0)
Platelets: 198 10*3/uL (ref 150–400)
RBC: 4.46 MIL/uL (ref 3.87–5.11)
RDW: 15.1 % (ref 11.5–15.5)
WBC: 7.8 10*3/uL (ref 4.0–10.5)
nRBC: 0 % (ref 0.0–0.2)

## 2020-11-23 MED ORDER — ACETAMINOPHEN 325 MG PO TABS
650.0000 mg | ORAL_TABLET | Freq: Once | ORAL | Status: AC
  Administered 2020-11-23: 650 mg via ORAL
  Filled 2020-11-23: qty 2

## 2020-11-23 MED ORDER — AMLODIPINE BESYLATE 5 MG PO TABS
5.0000 mg | ORAL_TABLET | Freq: Every day | ORAL | 0 refills | Status: DC
Start: 2020-11-23 — End: 2020-12-05

## 2020-11-23 NOTE — ED Provider Notes (Signed)
MSE was initiated and I personally evaluated the patient and placed orders (if any) at  7:53 PM on November 23, 2020.  The patient appears stable so that the remainder of the MSE may be completed by another provider.MSE was initiated and I personally evaluated the patient and placed orders (if any) at  7:53 PM on November 23, 2020.  The patient appears stable so that the remainder of the MSE may be completed by another provider.  Full noted started at this time   Linwood Dibbles, MD 11/23/20 [phone]

## 2020-11-23 NOTE — Discharge Instructions (Addendum)
Take the medications as prescribed.  Follow up with your doctor to be rechecked.  Return as needed for worsening symptoms.  Norvasc is on the 4 dollar [facility] list

## 2020-11-23 NOTE — ED Provider Notes (Signed)
Patient placed in Quick Look pathway, seen and evaluated   Chief Complaint: high blood pressure  HPI:   Pt reports her blood pressure has been high since yesterday ROS: no chest pain no shortness of breath  Physical Exam:   Gen: No distress  Neuro: Awake and Alert  Skin: Warm    Focused Exam: wdwn  Blood pressure 149/101  Lungs clear, Heart RRR    Initiation of care has begun. The patient has been counseled on the process, plan, and necessity for staying for the completion/evaluation, and the remainder of the medical screening examination   Osie Cheeks 11/23/20 1712    Sabino Donovan, MD 11/26/20 [phone]

## 2020-11-23 NOTE — ED Triage Notes (Signed)
Pt here with reports of hypertension. Endorses headache and blurry vision for the last few days. Took BP at home and was 170/119. Pt denies known hx of hypertension. Took spouses spironolactone with minimal improvement.

## 2020-11-23 NOTE — ED Triage Notes (Signed)
Pt is c/o HA and blurred vision that start 3-4 days ago  - states her BP was high today  -  171 /  109  -  No known HTN    Denies dizzy  /  CP   -  Took  Spironolactone this morning that was given  By a friend  -   Pain score 7/10  -  Went to  [employer] and was there for 5 hrs and she decided to  Come up here -

## 2020-11-23 NOTE — ED Notes (Signed)
Critical BP 135/115 reported to RN and MD. Pt respiratory status is stable w/no distress at this time. RT will continue to monitor.

## 2020-11-23 NOTE — ED Provider Notes (Signed)
[facility] EMERGENCY DEPT Provider Note   CSN: [id number] Arrival date & time: 11/23/20  1919     History Chief Complaint  Patient presents with  . Headache  . Hypertension    [name] Silva is a 45 y.o. female.  HPI   Patient presents to the ED with complaints of high blood pressure.  Patient states yesterday she noted that she was feeling slightly lightheaded.  She took her blood pressure was elevated 170s.  She talked to a friend who had spironolactone and she took that yesterday morning.  Her blood pressure improved.  Patient states her blood pressure was elevated again this morning so she took another dose of the spironolactone.  She is not having any trouble with chest pain or shortness of breath.  She did have mild headache.  She did not have any fevers or chills.  No vomiting or diarrhea.  Past Medical History:  Diagnosis Date  . Anxiety   . Carpal tunnel syndrome of left wrist 06/2016  . Dental crown present   . Obesity   . Sleep apnea    no CPAP use, mild  . Smoker   . Wears glasses     Patient Active Problem List   Diagnosis Date Noted  . 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 OSA (obstructive sleep apnea) 05/07/2016  . Vitamin D deficiency 11/05/2015  . Borderline high blood pressure 09/27/2013  . Morbid obesity with BMI of 45.0-49.9, adult (HCC) 09/26/2013  . Generalized anxiety disorder 06/02/2011  . Tobacco use disorder 05/20/2011  . Obesity 05/20/2011    Past Surgical History:  Procedure Laterality Date  . BILATERAL SALPINGECTOMY Bilateral 01/09/2017   Procedure: LAPAROSCOPIC SALPINGECTOMY;  Surgeon: Lavina Hamman, MD;  Location: [facility];  Service: Gynecology;  Laterality: Bilateral;  . CARPAL TUNNEL RELEASE Left 07/08/2016   Procedure: CARPAL TUNNEL RELEASE, left;  Surgeon: Cindee Salt, MD;  Location: [facility];  Service: Orthopedics;  Laterality: Left;  FAB  . CARPAL TUNNEL RELEASE Right 07/10/2017  . CESAREAN SECTION   04/10/2006     OB History    Gravida  1   Para  1   Term      Preterm      AB      Living  1     SAB      IAB      Ectopic      Multiple      Live Births              Family History  Problem Relation Age of Onset  . COPD Mother   . Lumbar disc disease Mother   . Cancer Mother        cervical cancer; laryngeal CA with mets   . Anxiety disorder Mother   . Depression Mother   . Diabetes Father   . Hypertension Father   . Hyperlipidemia Father   . COPD Maternal Uncle        lung  . Cancer Maternal Uncle        lung cancer  . Stroke Maternal Grandmother   . Heart disease Maternal Grandfather     Social History   Tobacco Use  . Smoking status: Current Every Day Smoker    Packs/day: 1.00    Years: 20.00    Pack years: 20.00    Types: Cigarettes  . Smokeless tobacco: Never Used  Vaping Use  . Vaping Use: Former  Substance Use Topics  . Alcohol use: Yes  Alcohol/week: 1.0 standard drink    Types: 1 Standard drinks or equivalent per week    Comment: socially, a few drinks/month  . Drug use: No    Home Medications Prior to Admission medications   Medication Sig Start Date End Date Taking? Authorizing Provider  amLODipine (NORVASC) 5 MG tablet Take 1 tablet (5 mg total) by mouth daily. 11/23/20 12/23/20 Yes Linwood Dibbles, MD  ALPRAZolam Prudy Feeler) 0.5 MG tablet TAKE 1/2 TO 1 (ONE-HALF TO ONE) TABLET BY MOUTH THREE TIMES DAILY AS NEEDED 09/05/20   Joselyn Arrow, MD  Cholecalciferol (VITAMIN D) 50 MCG (2000 UT) CAPS Take 1 capsule by mouth daily.    [provider]  citalopram (CELEXA) 10 MG tablet Take 1 tablet by mouth once daily 05/16/20   Joselyn Arrow, MD  mupirocin ointment (BACTROBAN) 2 % Apply 1 application topically 2 (two) times daily as needed. 04/26/20   Joselyn Arrow, MD    Allergies    Amoxicillin-pot clavulanate  Review of Systems   Review of Systems  All other systems reviewed and are negative.   Physical Exam Updated Vital Signs BP  (!) 135/115   Pulse 71   Temp 98.7 F (37.1 C) (Oral)   Resp 20   Ht 1.676 m (5\' 6" )   Wt 122.5 kg   LMP 11/12/2019   SpO2 98%   BMI 43.58 kg/m   Physical Exam Vitals and nursing note reviewed.  Constitutional:      General: She is not in acute distress.    Appearance: She is well-developed.  HENT:     Head: Normocephalic and atraumatic.     Right Ear: External ear normal.     Left Ear: External ear normal.  Eyes:     General: No scleral icterus.       Right eye: No discharge.        Left eye: No discharge.     Conjunctiva/sclera: Conjunctivae normal.  Neck:     Trachea: No tracheal deviation.  Cardiovascular:     Rate and Rhythm: Normal rate and regular rhythm.  Pulmonary:     Effort: Pulmonary effort is normal. No respiratory distress.     Breath sounds: Normal breath sounds. No stridor. No wheezing or rales.  Abdominal:     General: Bowel sounds are normal. There is no distension.     Palpations: Abdomen is soft.     Tenderness: There is no abdominal tenderness. There is no guarding or rebound.  Musculoskeletal:        General: No tenderness.     Cervical back: Neck supple.  Skin:    General: Skin is warm and dry.     Findings: No rash.  Neurological:     Mental Status: She is alert and oriented to person, place, and time.     Cranial Nerves: No cranial nerve deficit (No facial droop, extraocular movements intact, tongue midline ).     Sensory: No sensory deficit.     Motor: No abnormal muscle tone or seizure activity.     Coordination: Coordination normal.     Comments: No pronator drift bilateral upper extrem, able to hold both legs off bed for 5 seconds, sensation intact in all extremities, no visual field cuts, no left or right sided neglect, normal finger-nose exam bilaterally, no nystagmus noted      ED Results / Procedures / Treatments   Labs (all labs ordered are listed, but only abnormal results are displayed) Labs Reviewed  CBC - Abnormal;  Notable for the following components:      Result Value   Hemoglobin 16.2 (*)    HCT 48.2 (*)    MCV 108.1 (*)    MCH 36.3 (*)    All other components within normal limits  BASIC METABOLIC PANEL - Abnormal; Notable for the following components:   Glucose, Bld 124 (*)    All other components within normal limits    EKG None  Radiology No results found.  Procedures Procedures   Medications Ordered in ED Medications  acetaminophen (TYLENOL) tablet 650 mg (650 mg Oral Given 11/23/20 1959)    ED Course  I have reviewed the triage vital signs and the nursing notes.  Pertinent labs & imaging results that were available during my care of the patient were reviewed by me and considered in my medical decision making (see chart for details).    MDM Rules/Calculators/A&P                          Patient presented to the ED for evaluation of headache as well as high blood pressure.  Patient was having blood pressures that were in the 170s systolic and 110s diastolic at home.  Patient's blood pressure has been proved here but she is taking someone else's blood pressure medication.  Patient's neurologic exam is reassuring.  She has no focal deficits.  I have low suspicion for stroke, TIA, subarachnoid hemorrhage or cerebral hemorrhage.  I do not think that CT scan imaging is necessary based on her exam.  I do think it is reasonable to start her on antihypertensive agents.  I will give her prescription for Norvasc.  This is on the $4 [employer] list that she requested.  Recommend close follow-up with her primary care doctor.  Warning signs precautions discussed Final Clinical Impression(s) / ED Diagnoses Final diagnoses:  Hypertension, unspecified type  Tension headache    Rx / DC Orders ED Discharge Orders         Ordered    amLODipine (NORVASC) 5 MG tablet  Daily        11/23/20 2118           Linwood Dibbles, MD 11/23/20 2121

## 2020-11-26 NOTE — Telephone Encounter (Signed)
I refilled it, but she needs an appointment.  She was seen in the ER within the week and started on BP meds.  I think she lost her insurance.  Check with her if she can self-pay here (I think that's what she did when no insurance in the past, if I recall).

## 2020-11-26 NOTE — Telephone Encounter (Signed)
Is this okay to refill? 
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
## 2020-12-05 ENCOUNTER — Other Ambulatory Visit: Payer: Self-pay

## 2020-12-05 ENCOUNTER — Ambulatory Visit (INDEPENDENT_AMBULATORY_CARE_PROVIDER_SITE_OTHER): Payer: Self-pay | Admitting: Family Medicine

## 2020-12-05 ENCOUNTER — Encounter: Payer: Self-pay | Admitting: Family Medicine

## 2020-12-05 VITALS — BP 150/86 | HR 84 | Ht 65.5 in | Wt 280.2 lb

## 2020-12-05 DIAGNOSIS — F1010 Alcohol abuse, uncomplicated: Secondary | ICD-10-CM

## 2020-12-05 DIAGNOSIS — F411 Generalized anxiety disorder: Secondary | ICD-10-CM

## 2020-12-05 DIAGNOSIS — Z6841 Body Mass Index (BMI) 40.0 and over, adult: Secondary | ICD-10-CM

## 2020-12-05 DIAGNOSIS — D582 Other hemoglobinopathies: Secondary | ICD-10-CM

## 2020-12-05 DIAGNOSIS — I10 Essential (primary) hypertension: Secondary | ICD-10-CM

## 2020-12-05 DIAGNOSIS — Z23 Encounter for immunization: Secondary | ICD-10-CM

## 2020-12-05 DIAGNOSIS — Z720 Tobacco use: Secondary | ICD-10-CM

## 2020-12-05 DIAGNOSIS — G4733 Obstructive sleep apnea (adult) (pediatric): Secondary | ICD-10-CM

## 2020-12-05 DIAGNOSIS — D7589 Other specified diseases of blood and blood-forming organs: Secondary | ICD-10-CM

## 2020-12-05 DIAGNOSIS — E559 Vitamin D deficiency, unspecified: Secondary | ICD-10-CM

## 2020-12-05 MED ORDER — AMLODIPINE BESYLATE 5 MG PO TABS: 5.0000 mg | ORAL_TABLET | Freq: Every day | ORAL | 0 refills | Status: DC

## 2020-12-05 NOTE — Patient Instructions (Signed)
You may take the blood pressure medication along with the citalopram and Vitamin D--to make life easier and remember everything daily.  Please work on cutting back on smoking--it is affecting your blood counts (higher). Please cut back on alcohol--this isn't good for you (regarding sleep apnea, liver, blood pressure, weight, etc)  Cut back on processed foods, meats, fast food. See info below on low sodium diet.  Daily exercise (walking, swimming--not just being busy/stressed which raises blood pressure) helps lower blood pressure, along with weight loss.  Sleep apnea can also contribute to higher blood pressure (and heart risks). Exercise, weight loss and cutting out the alcohol will help with lowering the sleep apnea (frequency/severity). We may need to consider treatment once you have insurance again.   https://www.mata.com/.pdf">  DASH Eating Plan DASH stands for Dietary Approaches to Stop Hypertension. The DASH eating plan is a healthy eating plan that has been shown to:  Reduce high blood pressure (hypertension).  Reduce your risk for type 2 diabetes, heart disease, and stroke.  Help with weight loss. What are tips for following this plan? Reading food labels  Check food labels for the amount of salt (sodium) per serving. Choose foods with less than 5 percent of the Daily Value of sodium. Generally, foods with less than 300 milligrams (mg) of sodium per serving fit into this eating plan.  To find whole grains, look for the word "whole" as the first word in the ingredient list. Shopping  Buy products labeled as "low-sodium" or "no salt added."  Buy fresh foods. Avoid canned foods and pre-made or frozen meals. Cooking  Avoid adding salt when cooking. Use salt-free seasonings or herbs instead of table salt or sea salt. Check with your health care provider or pharmacist before using salt substitutes.  Do not fry foods. Cook foods using  healthy methods such as baking, boiling, grilling, roasting, and broiling instead.  Cook with heart-healthy oils, such as olive, canola, avocado, soybean, or sunflower oil. Meal planning  Eat a balanced diet that includes: ? 4 or more servings of fruits and 4 or more servings of vegetables each day. Try to fill one-half of your plate with fruits and vegetables. ? 6-8 servings of whole grains each day. ? Less than 6 oz (170 g) of lean meat, poultry, or fish each day. A 3-oz (85-g) serving of meat is about the same size as a deck of cards. One egg equals 1 oz (28 g). ? 2-3 servings of low-fat dairy each day. One serving is 1 cup (237 mL). ? 1 serving of nuts, seeds, or beans 5 times each week. ? 2-3 servings of heart-healthy fats. Healthy fats called omega-3 fatty acids are found in foods such as walnuts, flaxseeds, fortified milks, and eggs. These fats are also found in cold-water fish, such as sardines, salmon, and mackerel.  Limit how much you eat of: ? Canned or prepackaged foods. ? Food that is high in trans fat, such as some fried foods. ? Food that is high in saturated fat, such as fatty meat. ? Desserts and other sweets, sugary drinks, and other foods with added sugar. ? Full-fat dairy products.  Do not salt foods before eating.  Do not eat more than 4 egg yolks a week.  Try to eat at least 2 vegetarian meals a week.  Eat more home-cooked food and less restaurant, buffet, and fast food.   Lifestyle  When eating at a restaurant, ask that your food be prepared with less salt or no  salt, if possible.  If you drink alcohol: ? Limit how much you use to:  0-1 drink a day for women who are not pregnant.  0-2 drinks a day for men. ? Be aware of how much alcohol is in your drink. In the [place] one drink equals one 12 oz bottle of beer (355 mL), one 5 oz glass of wine (148 mL), or one 1 oz glass of hard liquor (44 mL). General information  Avoid eating more than 2,300 mg of salt a  day. If you have hypertension, you may need to reduce your sodium intake to 1,500 mg a day.  Work with your health care provider to maintain a healthy body weight or to lose weight. Ask what an ideal weight is for you.  Get at least 30 minutes of exercise that causes your heart to beat faster (aerobic exercise) most days of the week. Activities may include walking, swimming, or biking.  Work with your health care provider or dietitian to adjust your eating plan to your individual calorie needs. What foods should I eat? Fruits All fresh, dried, or frozen fruit. Canned fruit in natural juice (without added sugar). Vegetables Fresh or frozen vegetables (raw, steamed, roasted, or grilled). Low-sodium or reduced-sodium tomato and vegetable juice. Low-sodium or reduced-sodium tomato sauce and tomato paste. Low-sodium or reduced-sodium canned vegetables. Grains Whole-grain or whole-wheat bread. Whole-grain or whole-wheat pasta. Brown rice. Orpah Cobb. Bulgur. Whole-grain and low-sodium cereals. Pita bread. Low-fat, low-sodium crackers. Whole-wheat flour tortillas. Meats and other proteins Skinless chicken or [place]. Ground chicken or [place]. Pork with fat trimmed off. Fish and seafood. Egg whites. Dried beans, peas, or lentils. Unsalted nuts, nut butters, and seeds. Unsalted canned beans. Lean cuts of beef with fat trimmed off. Low-sodium, lean precooked or cured meat, such as sausages or meat loaves. Dairy Low-fat (1%) or fat-free (skim) milk. Reduced-fat, low-fat, or fat-free cheeses. Nonfat, low-sodium ricotta or cottage cheese. Low-fat or nonfat yogurt. Low-fat, low-sodium cheese. Fats and oils Soft margarine without trans fats. Vegetable oil. Reduced-fat, low-fat, or light mayonnaise and salad dressings (reduced-sodium). Canola, safflower, olive, avocado, soybean, and sunflower oils. Avocado. Seasonings and condiments Herbs. Spices. Seasoning mixes without salt. Other foods Unsalted popcorn  and pretzels. Fat-free sweets. The items listed above may not be a complete list of foods and beverages you can eat. Contact a dietitian for more information. What foods should I avoid? Fruits Canned fruit in a light or heavy syrup. Fried fruit. Fruit in cream or butter sauce. Vegetables Creamed or fried vegetables. Vegetables in a cheese sauce. Regular canned vegetables (not low-sodium or reduced-sodium). Regular canned tomato sauce and paste (not low-sodium or reduced-sodium). Regular tomato and vegetable juice (not low-sodium or reduced-sodium). Rosita Fire. Olives. Grains Baked goods made with fat, such as croissants, muffins, or some breads. Dry pasta or rice meal packs. Meats and other proteins Fatty cuts of meat. Ribs. Fried meat. Tomasa Blase. Bologna, salami, and other precooked or cured meats, such as sausages or meat loaves. Fat from the back of a pig (fatback). Bratwurst. Salted nuts and seeds. Canned beans with added salt. Canned or smoked fish. Whole eggs or egg yolks. Chicken or [place] with skin. Dairy Whole or 2% milk, cream, and half-and-half. Whole or full-fat cream cheese. Whole-fat or sweetened yogurt. Full-fat cheese. Nondairy creamers. Whipped toppings. Processed cheese and cheese spreads. Fats and oils Butter. Stick margarine. Lard. Shortening. Ghee. Bacon fat. Tropical oils, such as coconut, palm kernel, or palm oil. Seasonings and condiments Onion salt, garlic salt, seasoned  salt, table salt, and sea salt. Worcestershire sauce. Tartar sauce. Barbecue sauce. Teriyaki sauce. Soy sauce, including reduced-sodium. Steak sauce. Canned and packaged gravies. Fish sauce. Oyster sauce. Cocktail sauce. Store-bought horseradish. Ketchup. Mustard. Meat flavorings and tenderizers. Bouillon cubes. Hot sauces. Pre-made or packaged marinades. Pre-made or packaged taco seasonings. Relishes. Regular salad dressings. Other foods Salted popcorn and pretzels. The items listed above may not be a complete  list of foods and beverages you should avoid. Contact a dietitian for more information. Where to find more information  National Heart, Lung, and Blood Institute: PopSteam.is  American Heart Association: www.heart.org  Academy of Nutrition and Dietetics: www.eatright.org  National Kidney Foundation: www.kidney.org Summary  The DASH eating plan is a healthy eating plan that has been shown to reduce high blood pressure (hypertension). It may also reduce your risk for type 2 diabetes, heart disease, and stroke.  When on the DASH eating plan, aim to eat more fresh fruits and vegetables, whole grains, lean proteins, low-fat dairy, and heart-healthy fats.  With the DASH eating plan, you should limit salt (sodium) intake to 2,300 mg a day. If you have hypertension, you may need to reduce your sodium intake to 1,500 mg a day.  Work with your health care provider or dietitian to adjust your eating plan to your individual calorie needs. This information is not intended to replace advice given to you by your health care provider. Make sure you discuss any questions you have with your health care provider. Document Revised: 07/01/2019 Document Reviewed: 07/01/2019 Elsevier Patient Education  2021 [employer].

## 2021-03-07 ENCOUNTER — Other Ambulatory Visit: Payer: Self-pay | Admitting: Family Medicine

## 2021-03-07 DIAGNOSIS — I10 Essential (primary) hypertension: Secondary | ICD-10-CM

## 2021-03-07 NOTE — Telephone Encounter (Signed)
I know that she is transferring care.  When is appointment?  Cover only until appt (ie 30d if within a month, rather than 90)

## 2021-03-07 NOTE — Telephone Encounter (Signed)
Is it okay to fill this one last time?

## 2021-05-01 ENCOUNTER — Encounter: Payer: 59 | Admitting: Family Medicine

## 2021-06-11 ENCOUNTER — Encounter: Payer: Self-pay | Admitting: Family Medicine

## 2021-06-11 DIAGNOSIS — F411 Generalized anxiety disorder: Secondary | ICD-10-CM

## 2021-06-12 MED ORDER — ALPRAZOLAM 0.5 MG PO TABS: ORAL_TABLET | ORAL | 0 refills | Status: DC

## 2021-06-12 MED ORDER — CITALOPRAM HYDROBROMIDE 10 MG PO TABS: 10.0000 mg | ORAL_TABLET | Freq: Every day | ORAL | 0 refills | Status: DC

## 2021-07-16 NOTE — Patient Instructions (Addendum)
  HEALTH MAINTENANCE RECOMMENDATIONS:  It is recommended that you get at least 30 minutes of aerobic exercise at least 5 days/week (for weight loss, you may need as much as 60-90 minutes). This can be any activity that gets your heart rate up. This can be divided in 10-15 minute intervals if needed, but try and build up your endurance at least once a week.  Weight bearing exercise is also recommended twice weekly.  Eat a healthy diet with lots of vegetables, fruits and fiber.  "Colorful" foods have a lot of vitamins (ie green vegetables, tomatoes, red peppers, etc).  Limit sweet tea, regular sodas and alcoholic beverages, all of which has a lot of calories and sugar.  Up to 1 alcoholic drink daily may be beneficial for women (unless trying to lose weight, watch sugars).  Drink a lot of water.  Calcium recommendations are 1200-1500 mg daily (1500 mg for postmenopausal women or women without ovaries), and vitamin D 1000 IU daily.  This should be obtained from diet and/or supplements (vitamins), and calcium should not be taken all at once, but in divided doses.  Monthly self breast exams and yearly mammograms for women over the age of 109 is recommended.  Sunscreen of at least SPF 30 should be used on all sun-exposed parts of the skin when outside between the hours of 10 am and 4 pm (not just when at beach or pool, but even with exercise, golf, tennis, and yard work!)  Use a sunscreen that says "broad spectrum" so it covers both UVA and UVB rays, and make sure to reapply every 1-2 hours.  Remember to change the batteries in your smoke detectors when changing your clock times in the spring and fall. Carbon monoxide detectors are recommended for your home.  Use your seat belt every time you are in a car, and please drive safely and not be distracted with cell phones and texting while driving.  We discussed the [facility] through [facility] (Dr. Dalbert Garnet, Dr. Manson Passey). There may be a wait  to get into this office. Consider other options  I think something that holds you accountable to your diet and exercise is good . These things include Weight Watchers, or using an app to track your diet--MyFitnessPal or LoseIt.  You NEED TO STOP THE SWEET TEA. Avoid regular sodas. Try and drink more water. You may use diet soda for the caffeine if needed as you transition.  You may use the nicotine patch or gum if needed to help when you need to quit smoking. NCQuitline.com or 1800-QUITNOW has good resources.  Please monitor BP at home.  Goal is <130/80. Limit the sodium in your diet to help keep it low. Daily exercise (30 mins) and weight loss will also help keep the BP down without needing additional medications.  We are checking your Vitamin D today and will let you know recommendations based on the results. I suspect you will need to take the prescription that you already have. I'll also let you know how much to take upon finishing the prescription.  I'm going to say likely 2000 IU of D3 every day, so make sure that you have a large bottle of 2000 IU at home.   [facility] and schedule your mammogram.
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
## 2021-07-17 ENCOUNTER — Ambulatory Visit (INDEPENDENT_AMBULATORY_CARE_PROVIDER_SITE_OTHER): Payer: Medicaid Other | Admitting: Family Medicine

## 2021-07-17 ENCOUNTER — Encounter: Payer: Self-pay | Admitting: Family Medicine

## 2021-07-17 ENCOUNTER — Other Ambulatory Visit: Payer: Self-pay

## 2021-07-17 VITALS — BP 138/90 | HR 64 | Ht 66.0 in | Wt 286.6 lb

## 2021-07-17 DIAGNOSIS — G4733 Obstructive sleep apnea (adult) (pediatric): Secondary | ICD-10-CM

## 2021-07-17 DIAGNOSIS — I10 Essential (primary) hypertension: Secondary | ICD-10-CM | POA: Diagnosis not present

## 2021-07-17 DIAGNOSIS — R5383 Other fatigue: Secondary | ICD-10-CM | POA: Diagnosis not present

## 2021-07-17 DIAGNOSIS — F411 Generalized anxiety disorder: Secondary | ICD-10-CM

## 2021-07-17 DIAGNOSIS — Z720 Tobacco use: Secondary | ICD-10-CM

## 2021-07-17 DIAGNOSIS — Z6841 Body Mass Index (BMI) 40.0 and over, adult: Secondary | ICD-10-CM

## 2021-07-17 DIAGNOSIS — Z0000 Encounter for general adult medical examination without abnormal findings: Secondary | ICD-10-CM

## 2021-07-17 DIAGNOSIS — D582 Other hemoglobinopathies: Secondary | ICD-10-CM | POA: Diagnosis not present

## 2021-07-17 DIAGNOSIS — D7589 Other specified diseases of blood and blood-forming organs: Secondary | ICD-10-CM | POA: Diagnosis not present

## 2021-07-17 DIAGNOSIS — E559 Vitamin D deficiency, unspecified: Secondary | ICD-10-CM | POA: Diagnosis not present

## 2021-07-17 LAB — POCT URINALYSIS DIP (PROADVANTAGE DEVICE)
Blood, UA: NEGATIVE
Glucose, UA: NEGATIVE mg/dL
Nitrite, UA: NEGATIVE
Specific Gravity, Urine: 1.03
Urobilinogen, Ur: NEGATIVE
pH, UA: 6 (ref 5.0–8.0)

## 2021-07-18 ENCOUNTER — Encounter: Payer: Self-pay | Admitting: Family Medicine

## 2021-07-18 LAB — CBC WITH DIFFERENTIAL/PLATELET
Basophils Absolute: 0.1 10*3/uL (ref 0.0–0.2)
Basos: 1 %
EOS (ABSOLUTE): 0.1 10*3/uL (ref 0.0–0.4)
Eos: 1 %
Hematocrit: 44.6 % (ref 34.0–46.6)
Hemoglobin: 15.2 g/dL (ref 11.1–15.9)
Immature Grans (Abs): 0 10*3/uL (ref 0.0–0.1)
Immature Granulocytes: 0 %
Lymphocytes Absolute: 2.8 10*3/uL (ref 0.7–3.1)
Lymphs: 36 %
MCH: 34.9 pg — ABNORMAL HIGH (ref 26.6–33.0)
MCHC: 34.1 g/dL (ref 31.5–35.7)
MCV: 102 fL — ABNORMAL HIGH (ref 79–97)
Monocytes Absolute: 0.5 10*3/uL (ref 0.1–0.9)
Monocytes: 6 %
Neutrophils Absolute: 4.4 10*3/uL (ref 1.4–7.0)
Neutrophils: 56 %
Platelets: 245 10*3/uL (ref 150–450)
RBC: 4.36 x10E6/uL (ref 3.77–5.28)
RDW: 13.1 % (ref 11.7–15.4)
WBC: 7.8 10*3/uL (ref 3.4–10.8)

## 2021-07-18 LAB — COMPREHENSIVE METABOLIC PANEL
ALT: 18 IU/L (ref 0–32)
AST: 17 IU/L (ref 0–40)
Albumin/Globulin Ratio: 1.7 (ref 1.2–2.2)
Albumin: 4.5 g/dL (ref 3.8–4.8)
Alkaline Phosphatase: 101 IU/L (ref 44–121)
BUN/Creatinine Ratio: 12 (ref 9–23)
BUN: 11 mg/dL (ref 6–24)
Bilirubin Total: 0.5 mg/dL (ref 0.0–1.2)
CO2: 22 mmol/L (ref 20–29)
Calcium: 9.6 mg/dL (ref 8.7–10.2)
Chloride: 100 mmol/L (ref 96–106)
Creatinine, Ser: 0.89 mg/dL (ref 0.57–1.00)
Globulin, Total: 2.6 g/dL (ref 1.5–4.5)
Glucose: 89 mg/dL (ref 70–99)
Potassium: 4 mmol/L (ref 3.5–5.2)
Sodium: 141 mmol/L (ref 134–144)
Total Protein: 7.1 g/dL (ref 6.0–8.5)
eGFR: 82 mL/min/{1.73_m2} (ref >59)

## 2021-07-18 LAB — LIPID PANEL
Chol/HDL Ratio: 3.6 ratio (ref 0.0–4.4)
Cholesterol, Total: 218 mg/dL — ABNORMAL HIGH (ref 100–199)
HDL: 60 mg/dL (ref >39)
LDL Chol Calc (NIH): 144 mg/dL — ABNORMAL HIGH (ref 0–99)
Triglycerides: 79 mg/dL (ref 0–149)
VLDL Cholesterol Cal: 14 mg/dL (ref 5–40)

## 2021-07-18 LAB — HIV ANTIBODY (ROUTINE TESTING W REFLEX): HIV Screen 4th Generation wRfx: NONREACTIVE

## 2021-07-18 LAB — VITAMIN D 25 HYDROXY (VIT D DEFICIENCY, FRACTURES): Vit D, 25-Hydroxy: 22 ng/mL — ABNORMAL LOW (ref 30.0–100.0)

## 2021-07-18 LAB — TSH: TSH: 1.28 u[IU]/mL (ref 0.450–4.500)

## 2021-08-28 ENCOUNTER — Other Ambulatory Visit: Payer: Self-pay | Admitting: Family Medicine

## 2021-08-28 DIAGNOSIS — F411 Generalized anxiety disorder: Secondary | ICD-10-CM

## 2021-10-22 NOTE — Progress Notes (Deleted)
?  Patient presents for 3 month follow-up on hypertension, smoking and obesity. ? ?When seen at her physical in December, she reported significant changes occurring in [facility] clinical rotations.  She planned to quit smoking, along with her boyfriend, as well as work much harder on eating healthier, meal prepping, and losing weight. ?We discussed in detail at that visit dietary recommendations, including high protein, low carb, healthy fast food choices, meal prep, portions.  Discussed [employer], using tracking apps such as MFP or LoseIt. Discussed [facility] clinic.  Briefly discussed medications. Not appropriate for phentermine (pt had asked about this medication), due to HTN, which was borderline at last visit. ? ?In December she reported drinking a large sweet tea every day, this is her preferred drink.  Occasionally has a regular soda. ?UPDATE ? ?HTN--compliant with amlodipine 10mg  daily.   ?She continues to limit bacon, sausage and ham, eating more chicken. Eating more at home in the evenings. ?Fast food--HOW OFTEN ? ?BP Readings from Last 3 Encounters:  ?07/17/21 138/90  ?12/05/20 (!) 150/86  ?11/23/20 (!) 135/115  ? ?Lipids were elevated/borderline on last check: ?Lab Results  ?Component Value Date  ? CHOL 218 (H) 07/17/2021  ? HDL 60 07/17/2021  ? LDLCALC 144 (H) 07/17/2021  ? TRIG 79 07/17/2021  ? CHOLHDL 3.6 07/17/2021  ? ? ?Tobacco use:  In December she reported smoking <1/2 PPD during the week, up to 3/4 PPD on the weekends. Smoking less due to school having smoke-free campus. ? ?Vitamin D deficiency:  Level in December was 22. She had reported being rx'd vitamin D from another provider, but never taking it (and had it at home).  She was advised to take prescription replacement for 12 weeks (unsure how much she had, was to contact January for refill if <12 wks), and then to start taking 2000 IU daily. ?UPDATE ? ? ? ?PMH, PSH, SH reviewed ? ? ? ?ROS: Denies fever, chills, URI symptoms, headaches, dizziness,  shortness of breath, chest pain, edema.  Denies nausea, vomiting, bowel changes, urinary complaints, bleeding, bruising, rash. ?Moods have remained good, anxiety is controlled. ? ?See HPI ? ? ?PHYSICAL EXAM: ? ?There were no vitals taken for this visit. ? ?Wt Readings from Last 3 Encounters:  ?07/17/21 286 lb 9.6 oz (130 kg)  ?12/05/20 280 lb 3.2 oz (127.1 kg)  ?11/23/20 270 lb (122.5 kg)  ? ? ? ?

## 2021-10-23 ENCOUNTER — Encounter: Payer: Medicaid Other | Admitting: Family Medicine

## 2021-10-23 DIAGNOSIS — Z720 Tobacco use: Secondary | ICD-10-CM

## 2021-10-23 DIAGNOSIS — I10 Essential (primary) hypertension: Secondary | ICD-10-CM

## 2021-10-23 DIAGNOSIS — E559 Vitamin D deficiency, unspecified: Secondary | ICD-10-CM

## 2021-11-04 ENCOUNTER — Encounter: Payer: Medicaid Other | Admitting: Family Medicine

## 2021-11-20 NOTE — Progress Notes (Signed)
Chief Complaint  ?Patient presents with  ? Consult  ?  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   ? ? ?Patient presents for follow-up on blood pressure, weight, smoking, anxiety. ?She actually cancelled the 3 month follow-up visit that had been scheduled, as her boyfriend was in the hospital.  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 ? 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 She reports this was based on 1 person's decision (a [employer]ud[employer]), had great evaluations from others. She is trying to appeal the decision, and has meetings set up with director, and the dean.  She can't sleep, has been very anxious.  She has needed to use alprazolam  2x/week since this happened, for severe anxiety.  Hasn't had panic attacks. ?Alprazolam was last filled 11/2, prior was 11/26/20 (0.5mg  dose, #30). She is requesting refill today. ? ?She had seen a therapist in the past (related to her daughter's issues), but she moved to [facility] while she was in school, didn't follow her there. ? ?Other stressors include her daughter (has anxiety, ADD, had been out of school due to disorderly conduct, fights, had been on probation.  She was to start online school in January, but never did (hoping she will get GED at some point). Boyfriend's health is also a stressor--alcoholic, h/o variceal bleeding, he had relapsed. He went to detox, and has now been sober x 1 month. ? ?Tobacco use:  original plan had been to quit smoking, along with her boyfriend, in January.  This didn't happen. She had been smoking 6-7/day, related to not being able to smoke at work. She admits to smoking more in the last week since dismissed. ? ?Obesity: She had lost a lot of weight prior to COVID by eating healthier, getting regular exercise. Weight was 262# 6.4 oz in 11/2017.  She had regained weight--eating  more fast foods, more snacking, mindless eating, and no exercise. In December she reported being ready to commit to losing weight. She had cut back on bacon/sausage/ham, eating more chicken, eating home more in the evenings (though still having some fast foods). She was still having a large sweet tea daily, and an occasional regular soda. ?She was asking about using phentermine at her last visit, heard about from a friend, but we discussed this not being a good option for her due to her HTN. ?She was encouraged to stop sweet tea/soda, meal prep, limit portions. Rec high protein, low carb diet. ?Discussed [employer], using tracking apps such as MFP or LoseIt. Discussed [facility] clinic.  Briefly discussed medications (not appropriate for phentermine due to HTN.) ? ?Today she reports she had been doing really well since January, until the last week.  Her typical routine had been having a protein bar in the morning, chicken wheat wraps for lunch from cafeteria, water. She cut back on sweet tea and soda to 1-2x/week. ?  ?Hypertension: taking amlodipine 10mg  daily.  BP was elevated/borderline at her last visit in December.  She was continued on same dose, trying to improve diet, exercise, weight loss. ?Hasn't checked BP elsewhere. ?Hasn't had headache, or vision changes as she has had in the past when it was high. ? ?Vitamin D deficiency:  Last level was 22 in 07/2021.  She was treated with another 12 week prescription course, and was to start 2000 IU of  D3 daily upon completion. ?She is currently still taking the prescription still, has 1 capsule left. ? ? ? ?PMH, PSH, SH reviewed ? ?Outpatient Encounter Medications as of 11/21/2021  ?Medication Sig Note  ? ALPRAZolam (XANAX) 0.5 MG tablet TAKE 1/2 TO 1 (ONE-HALF TO ONE) TABLET BY MOUTH THREE TIMES DAILY AS NEEDED 11/21/2021: Last took one Tuesday  ? amLODipine (NORVASC) 10 MG tablet Take 10 mg by mouth daily.   ? Cholecalciferol (VITAMIN D) 50 MCG (2000 UT) CAPS Take 1 capsule by  mouth daily.   ? citalopram (CELEXA) 10 MG tablet Take 1 tablet by mouth once daily   ? ?No facility-administered encounter medications on file as of 11/21/2021.  ? ?Allergies  ?Allergen Reactions  ? Amoxicillin-Pot Clavulanate Rash  ? ? ?ROS:  no fever, chills, URI symptoms, chest pain, shortness of breath. No panic attacks.  +anxiety/depression, poor sleep.  No GI complaints or other concerns. ?See HPI ? ? ?PHYSICAL EXAM: ? ?BP 128/78   Pulse 76   Ht 5\' 6"  (1.676 m)   Wt 280 lb (127 kg)   LMP 10/27/2021   BMI 45.19 kg/m?  ? ? ?Wt Readings from Last 3 Encounters:  ?11/21/21 280 lb (127 kg)  ?07/17/21 286 lb 9.6 oz (130 kg)  ?12/05/20 280 lb 3.2 oz (127.1 kg)  ? ?Tearful patient, appears anxious. ?Normal hygiene, grooming, eye contact, speech. ?Full range of affect. ? ?She is alert and oriented. Cranial nerves grossly intact. ?Heart: regular rate and rhythm ?Lungs: clear ?Extremities: no edema ? ? ?  11/21/2021  ?  2:39 PM 07/17/2021  ?  3:01 PM 04/25/2020  ?  2:45 PM 12/06/2018  ?  9:53 AM 12/06/2018  ?  9:09 AM  ?Depression screen PHQ 2/9  ?Decreased Interest 1 0 0 0 0  ?Down, Depressed, Hopeless 2 0 0 0 0  ?PHQ - 2 Score 3 0 0 0 0  ?Altered sleeping 2   1   ?Tired, decreased energy 1   0   ?Change in appetite 1   0   ?Feeling bad or failure about yourself  2   0   ?Trouble concentrating 2   0   ?Moving slowly or fidgety/restless 0   0   ?Suicidal thoughts 0   0   ?PHQ-9 Score 11   1   ?Difficult doing work/chores Somewhat difficult      ? ? ?  11/21/2021  ?  2:39 PM 12/06/2018  ?  9:56 AM  ?GAD 7 : Generalized Anxiety Score  ?Nervous, Anxious, on Edge 3 2  ?Control/stop worrying 3 1  ?Worry too much - different things 3 0  ?Trouble relaxing 3 0  ?Restless 3 0  ?Easily annoyed or irritable 0 1  ?Afraid - awful might happen 0 0  ?Total GAD 7 Score 15 4  ?Anxiety Difficulty Somewhat difficult   ? ? ? ?ASSESSMENT/PLAN: ? ?Essential hypertension - well controlled, cont amlodipine ? ?Generalized anxiety disorder - poorly  controlled due to recent dismissal from program. Counseling rec. Increase citalopram to 15mg  (and further to 20mg  if tolerated/needed). Alprazolam prn - Plan: ALPRAZolam (XANAX) 0.5 MG tablet ? ?Vitamin D deficiency - reminded to start OTC D3 upon completion of the prescription course ? ?Morbid obesity with BMI of 45.0-49.9, adult ([place]) - congratulated on significant dietary improvements; some wt loss. Cont healthy diet, avoiding soda/sweet tea. Exercise rec ? ?Tobacco use - Encouraged cessation once stressors improve. Encouraged to get back to lower amount ? ?  Counseled re: relaxation techniques--to help when feeling overwhelmed during the day, and also when unable to sleep at night. Discussed available apps. Encouraged resuming counseling. ?Increase citalopram. Cont xanax prn--risks/SE/precautions reviewed. ?Start D3 2000 IU when finishes rx. ? ? ?F/u 2 months (sooner prn); can be virtual, if needed. ? ?I spent 35 minutes dedicated to the care of this patient, including pre-visit review of records, face to face time, post-visit ordering of testing and documentation. ? ?

## 2021-11-21 ENCOUNTER — Encounter: Payer: Self-pay | Admitting: Family Medicine

## 2021-11-21 ENCOUNTER — Ambulatory Visit: Payer: Medicaid Other | Admitting: Family Medicine

## 2021-11-21 ENCOUNTER — Ambulatory Visit (INDEPENDENT_AMBULATORY_CARE_PROVIDER_SITE_OTHER): Payer: 59 | Admitting: Family Medicine

## 2021-11-21 VITALS — BP 128/78 | HR 76 | Ht 66.0 in | Wt 280.0 lb

## 2021-11-21 DIAGNOSIS — E559 Vitamin D deficiency, unspecified: Secondary | ICD-10-CM

## 2021-11-21 DIAGNOSIS — Z6841 Body Mass Index (BMI) 40.0 and over, adult: Secondary | ICD-10-CM

## 2021-11-21 DIAGNOSIS — F411 Generalized anxiety disorder: Secondary | ICD-10-CM | POA: Diagnosis not present

## 2021-11-21 DIAGNOSIS — I10 Essential (primary) hypertension: Secondary | ICD-10-CM | POA: Diagnosis not present

## 2021-11-21 DIAGNOSIS — Z720 Tobacco use: Secondary | ICD-10-CM

## 2021-11-21 MED ORDER — ALPRAZOLAM 0.5 MG PO TABS: ORAL_TABLET | ORAL | 0 refills | Status: DC

## 2021-11-21 NOTE — Patient Instructions (Addendum)
Please look into finding another therapist. ?Consider Calm App, Mindshift, or others to help you relax. ?Consider meditation/visualization techniques (go to the beach!), along with relaxing deep breathing. ? ?I would increasing your citalopram to 1.5 tablets for a week. If you don't have any nausea, or other side effects, then increase to 2 tablets, and we can send in a new prescription for the 20mg . Send me a message to let me know if you are tolerating the 20mg  dose (vs if you need to stay on 15mg ) so that I can send in the appropriate refill. ? ?Be sure to start the vitamin D3 2000 IU daily upon completion of the prescription. Continue this long-term. ?

## 2021-11-26 ENCOUNTER — Encounter: Payer: Self-pay | Admitting: Family Medicine

## 2021-11-27 ENCOUNTER — Encounter: Payer: Self-pay | Admitting: *Deleted

## 2021-11-27 NOTE — Telephone Encounter (Signed)
Ok for letter.  Can be stated as requested, adding that she has been under my care for these issues for over 10 years. ?

## 2021-12-02 ENCOUNTER — Ambulatory Visit: Payer: Medicaid Other | Admitting: Family Medicine

## 2021-12-16 ENCOUNTER — Ambulatory Visit: Payer: Medicaid Other | Admitting: Family Medicine

## 2021-12-26 ENCOUNTER — Other Ambulatory Visit: Payer: Self-pay | Admitting: Family Medicine

## 2021-12-26 DIAGNOSIS — F411 Generalized anxiety disorder: Secondary | ICD-10-CM

## 2021-12-27 NOTE — Telephone Encounter (Signed)
Per her recent visit, dose was being titrated up.  See how  much patient is currently taking.  If taking 20mg , I would change the dose to 20mg  tablet; if taking 15mg , I'd change the quantity (taking 1.5 tablets daily--but only give 30d supply of either)--she has follow-up scheduled in June).

## 2021-12-27 NOTE — Telephone Encounter (Signed)
[facility] is requesting to fill pt celexa. Please advise [facility]

## 2021-12-27 NOTE — Telephone Encounter (Signed)
Lvm for pt  to call back to check the dose. [place]

## 2021-12-30 NOTE — Telephone Encounter (Signed)
She was supposed to try and increase the dose, per our last visit. See how she is doing regarding her anxiety, and if not doing well, she SHOULD increase the dose. If increasing the dose, can write for 1.5 tablet daily, with #135, rather than refilling the #90. She has f/u visit in June.

## 2021-12-30 NOTE — Telephone Encounter (Signed)
Pt called and left message that she is still taking the 10mg  but only has 2 pills left

## 2021-12-30 NOTE — Telephone Encounter (Signed)
Patient is doing well and taking the 10mg . Refill sent for #90 and reminded of appt in June.

## 2022-01-02 ENCOUNTER — Other Ambulatory Visit (HOSPITAL_BASED_OUTPATIENT_CLINIC_OR_DEPARTMENT_OTHER): Payer: Self-pay | Admitting: Family Medicine

## 2022-01-02 DIAGNOSIS — Z1231 Encounter for screening mammogram for malignant neoplasm of breast: Secondary | ICD-10-CM

## 2022-01-10 ENCOUNTER — Inpatient Hospital Stay (HOSPITAL_BASED_OUTPATIENT_CLINIC_OR_DEPARTMENT_OTHER): Admission: RE | Admit: 2022-01-10 | Payer: 59 | Source: Ambulatory Visit | Admitting: Radiology

## 2022-01-17 ENCOUNTER — Ambulatory Visit: Payer: 59 | Admitting: Obstetrics & Gynecology

## 2022-01-24 ENCOUNTER — Ambulatory Visit (HOSPITAL_COMMUNITY): Payer: 59

## 2022-01-24 ENCOUNTER — Ambulatory Visit (HOSPITAL_BASED_OUTPATIENT_CLINIC_OR_DEPARTMENT_OTHER)
Admission: RE | Admit: 2022-01-24 | Discharge: 2022-01-24 | Disposition: A | Discharge status: Home / Self Care | Payer: 59 | Source: Ambulatory Visit | Attending: Family Medicine | Admitting: Family Medicine

## 2022-01-24 DIAGNOSIS — Z1231 Encounter for screening mammogram for malignant neoplasm of breast: Secondary | ICD-10-CM | POA: Insufficient documentation

## 2022-01-29 ENCOUNTER — Encounter: Payer: 59 | Admitting: Family Medicine

## 2022-02-05 NOTE — Progress Notes (Unsigned)
  No chief complaint on file.   Patient presents for follow up on anxiety.  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 GAD-7 score was 15, and PHQ-9 score was 11. Citalopram was increased from 10 to 15mg  (and advised she could further increase to 20mg , if tolerated/needed).  She was given refill of xanax (#30 on 11/21/21). She was encouraged to get counseling.  Hypertension: Compliant with taking amlodipine 10mg  daily.  Denies headaches, chest pain, edema.  BP Readings from Last 3 Encounters:  11/21/21 128/78  07/17/21 138/90  12/05/20 (!) 150/86     Vitamin D deficiency:  Last level was 22 in 07/2021.  She completed rx course and is now taking (Should be 2000)    PMH, PSH, SH reviewed    ROS:  no fever, chills, URI symptoms, chest pain, shortness of breath. No panic attacks.   +anxiety/depression, poor sleep.  No GI complaints or other concerns. See HPI    PHYSICAL EXAM:  LMP 01/12/2022   Wt Readings from Last 3 Encounters:  11/21/21 280 lb (127 kg)  07/17/21 286 lb 9.6 oz (130 kg)  12/05/20 280 lb 3.2 oz (127.1 kg)       ASSESSMENT/PLAN:  GAD-7, PHQ-9  Anxiety--on 15 or 20mg  citalopram? Ensure taking D3 2000 IU smoking

## 2022-02-06 ENCOUNTER — Encounter: Payer: Self-pay | Admitting: Family Medicine

## 2022-02-06 ENCOUNTER — Ambulatory Visit (INDEPENDENT_AMBULATORY_CARE_PROVIDER_SITE_OTHER): Payer: 59 | Admitting: Family Medicine

## 2022-02-06 VITALS — BP 120/72 | HR 76 | Ht 66.0 in | Wt 283.4 lb

## 2022-02-06 DIAGNOSIS — E559 Vitamin D deficiency, unspecified: Secondary | ICD-10-CM | POA: Diagnosis not present

## 2022-02-06 DIAGNOSIS — F411 Generalized anxiety disorder: Secondary | ICD-10-CM

## 2022-02-06 DIAGNOSIS — Z720 Tobacco use: Secondary | ICD-10-CM

## 2022-02-06 DIAGNOSIS — I10 Essential (primary) hypertension: Secondary | ICD-10-CM

## 2022-02-06 DIAGNOSIS — Z6841 Body Mass Index (BMI) 40.0 and over, adult: Secondary | ICD-10-CM

## 2022-02-06 NOTE — Patient Instructions (Signed)
Please try and convince your boyfriend to set a quit date with you, prior to starting your new job (or shortly after). Start thinking about why/when you smoke (habit, boredom, stress) in order to come up with effective strategies to cut back or quit. Available resources to help you quit include free counseling through [facility] Quitline ([facility]Quitline[facility] or 1-800-QUITNOW).

## 2022-02-13 ENCOUNTER — Encounter: Payer: Self-pay | Admitting: Obstetrics & Gynecology

## 2022-02-13 ENCOUNTER — Other Ambulatory Visit (HOSPITAL_COMMUNITY)
Admission: RE | Admit: 2022-02-13 | Discharge: 2022-02-13 | Disposition: A | Discharge status: Home / Self Care | Payer: 59 | Source: Ambulatory Visit | Attending: Obstetrics & Gynecology | Admitting: Obstetrics & Gynecology

## 2022-02-13 ENCOUNTER — Ambulatory Visit (INDEPENDENT_AMBULATORY_CARE_PROVIDER_SITE_OTHER): Payer: 59 | Admitting: Obstetrics & Gynecology

## 2022-02-13 VITALS — BP 126/82 | HR 70 | Ht 66.0 in | Wt 282.6 lb

## 2022-02-13 DIAGNOSIS — Z01419 Encounter for gynecological examination (general) (routine) without abnormal findings: Secondary | ICD-10-CM | POA: Insufficient documentation

## 2022-02-13 DIAGNOSIS — Z0000 Encounter for general adult medical examination without abnormal findings: Secondary | ICD-10-CM | POA: Diagnosis not present

## 2022-02-13 MED ORDER — NORETHINDRONE 0.35 MG PO TABS: 1.0000 | ORAL_TABLET | Freq: Every day | ORAL | 11 refills | Status: DC
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
## 2022-02-17 LAB — CYTOLOGY - PAP
Comment: NEGATIVE
Diagnosis: NEGATIVE
High risk HPV: NEGATIVE

## 2022-03-21 ENCOUNTER — Other Ambulatory Visit: Payer: Self-pay | Admitting: Family Medicine

## 2022-03-21 DIAGNOSIS — F411 Generalized anxiety disorder: Secondary | ICD-10-CM

## 2022-04-01 ENCOUNTER — Other Ambulatory Visit: Payer: Self-pay

## 2022-04-01 ENCOUNTER — Encounter: Payer: Self-pay | Admitting: Family Medicine

## 2022-04-01 MED ORDER — AMLODIPINE BESYLATE 10 MG PO TABS: 10.0000 mg | ORAL_TABLET | Freq: Every day | ORAL | 1 refills | Status: DC

## 2022-04-16 ENCOUNTER — Encounter: Payer: Self-pay | Admitting: Internal Medicine

## 2022-04-27 ENCOUNTER — Other Ambulatory Visit: Payer: Self-pay | Admitting: Family Medicine

## 2022-04-27 DIAGNOSIS — F411 Generalized anxiety disorder: Secondary | ICD-10-CM

## 2022-04-28 ENCOUNTER — Other Ambulatory Visit: Payer: Self-pay | Admitting: *Deleted

## 2022-04-28 MED ORDER — AMLODIPINE BESYLATE 10 MG PO TABS: 10.0000 mg | ORAL_TABLET | Freq: Every day | ORAL | 0 refills | Status: DC

## 2022-05-20 ENCOUNTER — Encounter: Payer: Self-pay | Admitting: Internal Medicine

## 2022-06-18 ENCOUNTER — Encounter: Payer: Self-pay | Admitting: Family Medicine

## 2022-06-19 ENCOUNTER — Other Ambulatory Visit: Payer: Self-pay | Admitting: *Deleted

## 2022-06-19 DIAGNOSIS — F411 Generalized anxiety disorder: Secondary | ICD-10-CM

## 2022-06-19 MED ORDER — CITALOPRAM HYDROBROMIDE 10 MG PO TABS: 10.0000 mg | ORAL_TABLET | Freq: Every day | ORAL | 0 refills | Status: DC

## 2022-06-23 ENCOUNTER — Other Ambulatory Visit (HOSPITAL_COMMUNITY): Payer: Self-pay

## 2022-06-23 ENCOUNTER — Other Ambulatory Visit: Payer: Self-pay | Admitting: *Deleted

## 2022-06-23 DIAGNOSIS — F411 Generalized anxiety disorder: Secondary | ICD-10-CM

## 2022-06-23 MED ORDER — AMLODIPINE BESYLATE 10 MG PO TABS
10.0000 mg | ORAL_TABLET | Freq: Every day | ORAL | 0 refills | Status: DC
  Filled 2022-06-23: qty 90, 90d supply, fill #0

## 2022-06-23 MED ORDER — CITALOPRAM HYDROBROMIDE 10 MG PO TABS
10.0000 mg | ORAL_TABLET | Freq: Every day | ORAL | 0 refills | Status: DC
  Filled 2022-06-23: qty 90, 90d supply, fill #0

## 2022-08-07 ENCOUNTER — Encounter: Payer: 59 | Admitting: Family Medicine

## 2022-08-13 ENCOUNTER — Encounter: Payer: Self-pay | Admitting: Medical

## 2022-08-13 ENCOUNTER — Other Ambulatory Visit (INDEPENDENT_AMBULATORY_CARE_PROVIDER_SITE_OTHER): Payer: Medicaid Other

## 2022-08-13 ENCOUNTER — Telehealth: Payer: Medicaid Other | Admitting: Medical

## 2022-08-13 ENCOUNTER — Encounter: Payer: Self-pay | Admitting: Internal Medicine

## 2022-08-13 VITALS — Temp 100.0°F | Ht 66.0 in | Wt 280.0 lb

## 2022-08-13 DIAGNOSIS — R52 Pain, unspecified: Secondary | ICD-10-CM

## 2022-08-13 DIAGNOSIS — R509 Fever, unspecified: Secondary | ICD-10-CM | POA: Diagnosis not present

## 2022-08-13 LAB — POCT INFLUENZA A/B
Influenza A, POC: NEGATIVE
Influenza B, POC: NEGATIVE

## 2022-08-13 NOTE — Progress Notes (Signed)
Subjective:     Patient ID: [name] Silva, female   DOB: August 31, 1975, 47 y.o.   MRN: [id number]  This visit type was conducted due to national recommendations for restrictions regarding the COVID-19 Pandemic (e.g. social distancing) in an effort to limit this patient's exposure and mitigate transmission in our community.  Due to their co-morbid illnesses, this patient is at least at moderate risk for complications without adequate follow up.  This format is felt to be most appropriate for this patient at this time.    Documentation for virtual audio and video telecommunications through [place] encounter:  The patient was located at home. The provider was located in the office. The patient did consent to this visit and is aware of possible charges through their insurance for this visit.  The other persons participating in this telemedicine service were none. Time spent on call was 20 minutes and in review of previous records 20 minutes total.  This virtual service is not related to other E/M service within previous 7 days.   HPI Chief Complaint  Patient presents with   other    Started Monday fever off and on 100.9 body aches, nausea,  no vomiting, feel weak    Virtual for illness.   She notes body aches, weakness, fever, headache x 2 days.   No cough, no sore throat, but throat sounds scratchy.   Has had some nausea, but no vomiting.  No diarrhea.   No abdominal pain.  No rash.   No urinary change or urinary symptoms.  Has had some sick contacts at work.  Tested negative for COVID 2 days ago.  Tested again yesterday and was negative as well.  No other symptoms. No other aggravating or relieving factors. No other complaint.   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 90 tablet 0   Cholecalciferol (VITAMIN D) 50 MCG (2000 UT) CAPS Take 1 capsule by mouth daily.     citalopram (CELEXA) 10 MG tablet Take 1 tablet (10 mg total) by mouth daily. 90 tablet 0   norethindrone (MICRONOR) 0.35 MG tablet Take 1 tablet (0.35 mg total) by mouth daily. 28 tablet 11   ALPRAZolam (XANAX) 0.5 MG tablet TAKE 1/2 TO 1 (ONE-HALF TO ONE) TABLET BY MOUTH THREE TIMES DAILY AS NEEDED (Patient not taking: Reported on 08/13/2022) 30 tablet 0   No current facility-administered medications on file prior to visit.    Review of Systems As in subjective    Objective:   Physical Exam Due to coronavirus pandemic stay at home measures, patient visit was virtual and they were not examined in person.   Temp 100 F (37.8 C)   Ht 5\' 6"  (1.676 m)   Wt 280 lb (127 kg)   BMI 45.19 kg/m   General: Well-developed well-nourished no acute distress No labored breathing or wheezing Not particularly ill-appearing     Assessment:     Encounter Diagnoses  Name Primary?   Fever, unspecified fever cause Yes   Body aches       Plan:  We discussed symptoms and concerns.  She has limited symptoms that could suggest viral illness or flulike illness.  We discussed limitations of virtual consult.  She will come to our back parking lot this afternoon for flu testing.  She already had a negative COVID test.  She does not have a lot of symptoms but given flu prevalent in the community, this could be early symptoms of flu.  Work note will be emailed given her fever.  Advised rest, hydration, ibuprofen or Tylenol for fever and aches.  Discussed possible symptoms of flu that could worsen over the next few days.  Call or recheck if new or worse symptoms in the coming days  Katanya was seen today for other.  Diagnoses and all orders for this visit:  Fever, unspecified fever  cause  Body aches  F/u prn

## 2022-08-13 NOTE — Progress Notes (Signed)
done

## 2022-08-13 NOTE — Progress Notes (Signed)
Influenza test was negative.  Treat this as a viral illness with rest and hydration.  If new symptoms of concern and call back by Friday if much worse or if much different symptoms  She can use ibuprofen or Tylenol for fever and not feeling well.  Can use over-the-counter ibuprofen every 6 hours or over-the-counter Tylenol every 4 hours.  Consider extra vitamins such as Zicam or emergenC over-the-counter  No other specific recommendations at this time other than watchful waiting

## 2022-08-14 ENCOUNTER — Other Ambulatory Visit: Payer: Self-pay | Admitting: Medical

## 2022-08-14 ENCOUNTER — Other Ambulatory Visit (INDEPENDENT_AMBULATORY_CARE_PROVIDER_SITE_OTHER): Payer: Medicaid Other

## 2022-08-14 DIAGNOSIS — R350 Frequency of micturition: Secondary | ICD-10-CM

## 2022-08-14 LAB — POCT URINALYSIS DIP (PROADVANTAGE DEVICE)
Blood, UA: NEGATIVE
Glucose, UA: NEGATIVE mg/dL
Nitrite, UA: NEGATIVE
Protein Ur, POC: NEGATIVE mg/dL
Specific Gravity, Urine: 1.025
Urobilinogen, Ur: 0.2
pH, UA: 6 (ref 5.0–8.0)

## 2022-08-14 MED ORDER — NITROFURANTOIN MONOHYD MACRO 100 MG PO CAPS: 100.0000 mg | ORAL_CAPSULE | Freq: Two times a day (BID) | ORAL | 0 refills | Status: DC

## 2022-08-14 NOTE — Telephone Encounter (Signed)
I have been waiting on her to come in for UA.  I am leaving for the day.  I sent Macrobid antibiotic for possible UTI given new symptoms and fever yesterday.   If she comes in I still want to see UA, but I didn't want to not send treatment if urine symptoms are worsening.  If she leaves urine, send culture too

## 2022-08-17 NOTE — Progress Notes (Signed)
Results sent through MyChart

## 2022-08-18 LAB — CULTURE, URINE COMPREHENSIVE

## 2022-08-18 NOTE — Progress Notes (Signed)
How are your symptoms at this point?

## 2022-08-19 ENCOUNTER — Encounter: Payer: Self-pay | Admitting: Internal Medicine

## 2022-08-20 ENCOUNTER — Encounter: Payer: Self-pay | Admitting: Family Medicine

## 2022-08-20 ENCOUNTER — Other Ambulatory Visit: Payer: Self-pay | Admitting: Medical

## 2022-08-20 MED ORDER — SULFAMETHOXAZOLE-TRIMETHOPRIM 800-160 MG PO TABS: 1.0000 | ORAL_TABLET | Freq: Two times a day (BID) | ORAL | 0 refills | Status: DC

## 2022-09-16 ENCOUNTER — Ambulatory Visit: Payer: Medicaid Other | Admitting: Obstetrics & Gynecology

## 2022-09-28 NOTE — Progress Notes (Unsigned)
No chief complaint on file.  [name] Silva is a 47 y.o. female who presents for a complete physical.  She had GYN exam 02/2022 (and scheduled to see GYN next month)  She saw Audelia Acton last month for URI symptoms as well as UTI.  Urine culture didn't show infection.  She was treated with antibiotics. Currently denies any urinary symptoms.  Anxiety flared last year related to being dismissed from surgical tech program.  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   Also in school (associates degree in arts, online).  She continues to do well on citalopram 54m daily.  Last GAD-7 was 0 in June. Alprazolam--needing?  Once a week per June visit. UPDATE   Stressors have include her daughter (has anxiety, ADD, issues with disorderly conduct at school, fights--now in online school) and her boyfriend's health (alcoholic, variceal bleeding)  Per last visit, boyfriend is maintaining his sobriety.  UPDATE Smoking-- in June had discussed plans for quitting (along with her boyfriend) prior to starting her job. UPDATE   Hypertension: Compliant with taking amlodipine 1102mdaily.  Denies headaches, dizziness, chest pain, edema.  BP Readings from Last 3 Encounters:  02/13/22 126/82  02/06/22 120/72  11/21/21 128/78   Vitamin D deficiency:  Last level was 22 in 07/2021.  She was treated with prescription course, followed by 2000 IU daily. Currently taking 2000 IU daily, and is due for recheck.  Component Ref Range & Units 1 yr ago 2 yr ago 3 yr ago 4 yr ago 5 yr ago 6 yr ago  Vit D, 25-Hydroxy 30.0 - 100.0 ng/mL 22.0 Low  26.0 Low  CM 14.5 Low  CM 16.6 Low  CM 18 Low  R, CM 17 Low  R, CM   Hyperlipidemia:  LDL above goal, but good HDL and ratio. She is due for recheck. Current diet includes  UPDATE   Lab Results  Component Value Date   CHOL 218 (H) 07/17/2021   HDL 60 07/17/2021   LDLCALC 144 (H) 07/17/2021   TRIG 79 07/17/2021   CHOLHDL 3.6  07/17/2021    OSA:  She had sleep study done 11/2015.  This showed mild OSA, mild oxygen desaturation (86%). Her weight at the time of the study was 283#; she had lost weight, but then regained it. She snores some, but not terribly loudly, no apnea per boyfriend. Wakes up refreshed, but is tired later in the day.   Obesity: She had lost a lot of weight by eating healthier, getting regular exercise prior to [place] was 262# 6.4 oz in 11/2017.  She had regained weight--eating more fast foods, more snacking, mindless eating, and no exercise.  At her physical last year she asked about phentermine--not an appropriate choice due to her HTN.  We discussed diet in detail--high protein low carb, healthy fast food choices, meal prep, portions.  Discussed WW[employer] Mutualusing tracking apps such as M[employer] or LoseIt. Discussed [facility].    Wt Readings from Last 3 Encounters:  08/13/22 280 lb (127 kg)  02/13/22 282 lb 9.6 oz (128.2 kg)  02/06/22 283 lb 6.4 oz (128.5 kg)    Immunization History  Administered Date(s) Administered   Influenza Split 05/11/2022   Influenza,inj,Quad PF,6+ Mos 04/20/2014, 04/30/2015   Influenza-Unspecified 05/24/2021   PFIZER Comirnaty(Gray Top)Covid-19 Tri-Sucrose Vaccine 10/31/2020, 12/05/2020   Pneumococcal Polysaccharide-23 04/20/2014   Tdap 05/20/2011, 06/01/2021  Had 2 COVID vaccines.  Declines boosters Last Pap smear:  02/2022, normal, negative HR HPV Last mammogram: 01/2022 Last colonoscopy: never   Last DEXA: never   Ophtho: every 2 years, wears glasses Dentist:  Exercise:  very little, 2x/week (walks 1 mile).   PMH, PSH, SH and FH were reviewed and updated  ROS: The patient denies anorexia, headaches, vision changes, decreased hearing, ear pain, breast concerns, chest pain, palpitations, dizziness, syncope, dyspnea on exertion, swelling, nausea, vomiting, diarrhea, constipation, abdominal pain, melena, hematochezia, indigestion/heartburn, hematuria, incontinence,  dysuria, irregular menstrual cycles, vaginal discharge, odor or itch, joint pains, weakness, tremor, suspicious skin lesions, depression, abnormal bleeding/bruising, or enlarged lymph nodes.  No fever, chills, URI symptoms. +anxiety, per HPI, controlled.    PHYSICAL EXAM:  There were no vitals taken for this visit.  138/90 on repeat by MD  Wt Readings from Last 3 Encounters:  08/13/22 280 lb (127 kg)  02/13/22 282 lb 9.6 oz (128.2 kg)  02/06/22 283 lb 6.4 oz (128.5 kg)    General Appearance:   Alert, cooperative, no distress, appears stated age. She declined to get into gown for exam (plans to see GYN and derm).  Head:   Normocephalic, without obvious abnormality, atraumatic    Eyes:   PERRL, conjunctiva/corneas clear, EOM's intact, fundi benign.   Ears:   Normal TM's and external ear canals; scarring of L TM noted  Nose:   Not examined, wearing mask due to COVID-19 pandemic  Throat:   Not examined, wearing mask due to COVID-19 pandemic  Neck:   Supple, no lymphadenopathy; thyroid: no enlargement/tenderness/ nodules; no carotid bruit or JVD    Back:   Spine nontender, no curvature, ROM normal, no CVA tenderness    Lungs:   Clear to auscultation bilaterally without wheezes, rales or ronchi; respirations unlabored    Chest Wall:   No tenderness or deformity    Heart:   Regular rate and rhythm, S1 and S2 normal, no murmur, rub or gallop    Breast Exam:   Deferred to GYN    Abdomen:   Soft, non-tender, nondistended, normoactive bowel sounds, no masses, no hepatosplenomegaly. Obese    Genitalia:   Deferred to GYN         Extremities:   No clubbing, cyanosis or edema    Pulses:   2+ and symmetric all extremities    Skin:   Skin color, texture, turgor normal.  No rashes/lesions visualized, limited exam.   Lymph nodes:   Cervical and supraclavicular nodes are normal    Neurologic:   Normal strength, sensation and gait; reflexes 2+ and symmetric throughout                       Psych:    Normal mood, affect, hygiene and grooming   ASSESSMENT/PLAN:  PHQ-9, GAD-7  Offer/decline COVID booster  Quit smoking? Enter quit date   RF amlodipine, citalopram  Vit D, c-met, lipids, cbc TSH only if sx   Discussed monthly self breast exams and yearly mammograms after the age of 23; at least 30 minutes of aerobic activity at least 5 days/week, weight-bearing exercise at least 2x/wk; proper sunscreen use reviewed; healthy diet, including goals of calcium and vitamin D intake and alcohol recommendations (less than or equal to 1 drink/day) reviewed; regular seatbelt use; changing batteries in smoke detectors.  Immunization recommendations discussed--declines COVID booster. Others UTD. Colonoscopy recommendations reviewed, age 17.

## 2022-09-28 NOTE — Patient Instructions (Incomplete)
  HEALTH MAINTENANCE RECOMMENDATIONS:  It is recommended that you get at least 30 minutes of aerobic exercise at least 5 days/week (for weight loss, you may need as much as 60-90 minutes). This can be any activity that gets your heart rate up. This can be divided in 10-15 minute intervals if needed, but try and build up your endurance at least once a week.  Weight bearing exercise is also recommended twice weekly.  Eat a healthy diet with lots of vegetables, fruits and fiber.  "Colorful" foods have a lot of vitamins (ie green vegetables, tomatoes, red peppers, etc).  Limit sweet tea, regular sodas and alcoholic beverages, all of which has a lot of calories and sugar.  Up to 1 alcoholic drink daily may be beneficial for women (unless trying to lose weight, watch sugars).  Drink a lot of water.  Calcium recommendations are 1200-1500 mg daily (1500 mg for postmenopausal women or women without ovaries), and vitamin D 1000 IU daily.  This should be obtained from diet and/or supplements (vitamins), and calcium should not be taken all at once, but in divided doses.  Monthly self breast exams and yearly mammograms for women over the age of 32 is recommended.  Sunscreen of at least SPF 30 should be used on all sun-exposed parts of the skin when outside between the hours of 10 am and 4 pm (not just when at beach or pool, but even with exercise, golf, tennis, and yard work!)  Use a sunscreen that says "broad spectrum" so it covers both UVA and UVB rays, and make sure to reapply every 1-2 hours.  Remember to change the batteries in your smoke detectors when changing your clock times in the spring and fall. Carbon monoxide detectors are recommended for your home.  Use your seat belt every time you are in a car, and please drive safely and not be distracted with cell phones and texting while driving.  Please try and quit smoking--start thinking about why/when you smoke (habit, boredom, stress) in order to come up  with effective strategies to cut back or quit. Available resources to help you quit include free counseling through [facility]uit[facility] ([place]Quitline.com or 1-800-QUITNOW), they used to have smoking cessation classes through [facility] (call to find out schedule), over-the-counter nicotine replacements. Many insurance companies also have smoking cessation programs (which may decrease the cost of patches, meds if enrolled).  If these methods are not effective for you, and you are motivated to quit, return to discuss the possibility of prescription medications ([facility]). Please try and start an exercise routine along with any efforts to cut back on smoking--you will feel the improvement in your lungs, and hopefully prevent some weight gain.

## 2022-09-29 ENCOUNTER — Encounter: Payer: Self-pay | Admitting: *Deleted

## 2022-09-30 ENCOUNTER — Encounter: Payer: Self-pay | Admitting: Family Medicine

## 2022-09-30 ENCOUNTER — Ambulatory Visit: Payer: Medicaid Other | Admitting: Family Medicine

## 2022-09-30 VITALS — BP 120/82 | HR 84 | Ht 65.0 in | Wt 289.2 lb

## 2022-09-30 DIAGNOSIS — F411 Generalized anxiety disorder: Secondary | ICD-10-CM | POA: Diagnosis not present

## 2022-09-30 DIAGNOSIS — G4733 Obstructive sleep apnea (adult) (pediatric): Secondary | ICD-10-CM

## 2022-09-30 DIAGNOSIS — E559 Vitamin D deficiency, unspecified: Secondary | ICD-10-CM | POA: Diagnosis not present

## 2022-09-30 DIAGNOSIS — I10 Essential (primary) hypertension: Secondary | ICD-10-CM

## 2022-09-30 DIAGNOSIS — Z0000 Encounter for general adult medical examination without abnormal findings: Secondary | ICD-10-CM

## 2022-09-30 DIAGNOSIS — Z6841 Body Mass Index (BMI) 40.0 and over, adult: Secondary | ICD-10-CM

## 2022-09-30 DIAGNOSIS — Z720 Tobacco use: Secondary | ICD-10-CM | POA: Diagnosis not present

## 2022-09-30 DIAGNOSIS — E7800 Pure hypercholesterolemia, unspecified: Secondary | ICD-10-CM | POA: Diagnosis not present

## 2022-09-30 LAB — POCT URINALYSIS DIP (PROADVANTAGE DEVICE)
Blood, UA: NEGATIVE
Glucose, UA: NEGATIVE mg/dL
Nitrite, UA: NEGATIVE
Protein Ur, POC: NEGATIVE mg/dL
Specific Gravity, Urine: 1.025
Urobilinogen, Ur: 0.2
pH, UA: 6 (ref 5.0–8.0)

## 2022-09-30 MED ORDER — AMLODIPINE BESYLATE 10 MG PO TABS: 10.0000 mg | ORAL_TABLET | Freq: Every day | ORAL | 3 refills | Status: DC

## 2022-09-30 MED ORDER — CITALOPRAM HYDROBROMIDE 10 MG PO TABS: 10.0000 mg | ORAL_TABLET | Freq: Every day | ORAL | 3 refills | Status: DC

## 2022-10-01 LAB — CBC WITH DIFFERENTIAL/PLATELET
Basophils Absolute: 0.1 10*3/uL (ref 0.0–0.2)
Basos: 1 %
EOS (ABSOLUTE): 0.1 10*3/uL (ref 0.0–0.4)
Eos: 1 %
Hematocrit: 43.5 % (ref 34.0–46.6)
Hemoglobin: 15 g/dL (ref 11.1–15.9)
Immature Grans (Abs): 0 10*3/uL (ref 0.0–0.1)
Immature Granulocytes: 0 %
Lymphocytes Absolute: 2.9 10*3/uL (ref 0.7–3.1)
Lymphs: 40 %
MCH: 34 pg — ABNORMAL HIGH (ref 26.6–33.0)
MCHC: 34.5 g/dL (ref 31.5–35.7)
MCV: 99 fL — ABNORMAL HIGH (ref 79–97)
Monocytes Absolute: 0.5 10*3/uL (ref 0.1–0.9)
Monocytes: 6 %
Neutrophils Absolute: 3.7 10*3/uL (ref 1.4–7.0)
Neutrophils: 52 %
Platelets: 306 10*3/uL (ref 150–450)
RBC: 4.41 x10E6/uL (ref 3.77–5.28)
RDW: 12.5 % (ref 11.7–15.4)
WBC: 7.2 10*3/uL (ref 3.4–10.8)

## 2022-10-01 LAB — COMPREHENSIVE METABOLIC PANEL
ALT: 17 IU/L (ref 0–32)
AST: 18 IU/L (ref 0–40)
Albumin/Globulin Ratio: 1.8 (ref 1.2–2.2)
Albumin: 4.6 g/dL (ref 3.9–4.9)
Alkaline Phosphatase: 102 IU/L (ref 44–121)
BUN/Creatinine Ratio: 12 (ref 9–23)
BUN: 11 mg/dL (ref 6–24)
Bilirubin Total: 0.4 mg/dL (ref 0.0–1.2)
CO2: 23 mmol/L (ref 20–29)
Calcium: 9.7 mg/dL (ref 8.7–10.2)
Chloride: 101 mmol/L (ref 96–106)
Creatinine, Ser: 0.89 mg/dL (ref 0.57–1.00)
Globulin, Total: 2.5 g/dL (ref 1.5–4.5)
Glucose: 85 mg/dL (ref 70–99)
Potassium: 4.1 mmol/L (ref 3.5–5.2)
Sodium: 139 mmol/L (ref 134–144)
Total Protein: 7.1 g/dL (ref 6.0–8.5)
eGFR: 82 mL/min/{1.73_m2} (ref >59)

## 2022-10-01 LAB — LIPID PANEL
Chol/HDL Ratio: 4.1 ratio (ref 0.0–4.4)
Cholesterol, Total: 225 mg/dL — ABNORMAL HIGH (ref 100–199)
HDL: 55 mg/dL (ref >39)
LDL Chol Calc (NIH): 155 mg/dL — ABNORMAL HIGH (ref 0–99)
Triglycerides: 87 mg/dL (ref 0–149)
VLDL Cholesterol Cal: 15 mg/dL (ref 5–40)

## 2022-10-01 LAB — VITAMIN D 25 HYDROXY (VIT D DEFICIENCY, FRACTURES): Vit D, 25-Hydroxy: 29.4 ng/mL — ABNORMAL LOW (ref 30.0–100.0)

## 2022-10-06 ENCOUNTER — Other Ambulatory Visit: Payer: Self-pay | Admitting: Medical

## 2022-10-06 ENCOUNTER — Encounter: Payer: Self-pay | Admitting: Family Medicine

## 2022-10-06 DIAGNOSIS — A4902 Methicillin resistant Staphylococcus aureus infection, unspecified site: Secondary | ICD-10-CM

## 2022-10-06 MED ORDER — MUPIROCIN CALCIUM 2 % EX CREA: TOPICAL_CREAM | CUTANEOUS | 0 refills | Status: DC

## 2022-10-10 ENCOUNTER — Other Ambulatory Visit (HOSPITAL_COMMUNITY): Payer: Self-pay

## 2022-10-16 ENCOUNTER — Telehealth: Payer: Self-pay | Admitting: Family Medicine

## 2022-10-16 NOTE — Telephone Encounter (Signed)
PA for Mupirocin cream was changed to Ointment and no PA needed per [facility] pharmacist.

## 2022-10-21 ENCOUNTER — Ambulatory Visit (INDEPENDENT_AMBULATORY_CARE_PROVIDER_SITE_OTHER): Payer: Medicaid Other | Admitting: Obstetrics & Gynecology

## 2022-10-21 ENCOUNTER — Encounter: Payer: Self-pay | Admitting: Obstetrics & Gynecology

## 2022-10-21 VITALS — BP 132/82 | HR 69 | Ht 66.0 in | Wt 289.0 lb

## 2022-10-21 DIAGNOSIS — N938 Other specified abnormal uterine and vaginal bleeding: Secondary | ICD-10-CM

## 2022-10-21 DIAGNOSIS — N3281 Overactive bladder: Secondary | ICD-10-CM

## 2022-10-21 MED ORDER — SOLIFENACIN SUCCINATE 10 MG PO TABS: 10.0000 mg | ORAL_TABLET | Freq: Every day | ORAL | 11 refills | Status: DC

## 2022-10-21 MED ORDER — MEGESTROL ACETATE 40 MG PO TABS: ORAL_TABLET | ORAL | 12 refills | Status: DC
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
## 2022-10-28 ENCOUNTER — Encounter: Payer: Self-pay | Admitting: Family Medicine

## 2022-10-28 ENCOUNTER — Other Ambulatory Visit: Payer: Self-pay | Admitting: *Deleted

## 2022-10-28 DIAGNOSIS — F411 Generalized anxiety disorder: Secondary | ICD-10-CM

## 2022-10-28 MED ORDER — MUPIROCIN 2 % EX OINT: 1.0000 | TOPICAL_OINTMENT | Freq: Three times a day (TID) | CUTANEOUS | 0 refills | Status: DC

## 2022-10-28 MED ORDER — ALPRAZOLAM 0.5 MG PO TABS: ORAL_TABLET | ORAL | 0 refills | Status: DC

## 2022-10-28 NOTE — Telephone Encounter (Signed)
Sent to [facility] in [place], [place] ok'd. He spoke to pharmacist himself and ok's last week but they had no record of this.

## 2022-10-28 NOTE — Telephone Encounter (Signed)
I'm confused about the mupirocin, as it looks like that was taken care of 3/7--that ointment was covered, not cream.  Looks like it was sent again, but I thought this was already handled.

## 2022-12-02 ENCOUNTER — Ambulatory Visit: Payer: Medicaid Other | Admitting: Obstetrics & Gynecology

## 2022-12-08 NOTE — Telephone Encounter (Signed)
PA for Mupirocin cream was changed to Ointment and no PA needed per [facility] pharmacist. 

## 2023-01-07 ENCOUNTER — Encounter: Payer: Self-pay | Admitting: Obstetrics & Gynecology

## 2023-01-08 DIAGNOSIS — E785 Hyperlipidemia, unspecified: Secondary | ICD-10-CM | POA: Diagnosis not present

## 2023-01-08 DIAGNOSIS — I10 Essential (primary) hypertension: Secondary | ICD-10-CM | POA: Diagnosis not present

## 2023-01-08 DIAGNOSIS — R0683 Snoring: Secondary | ICD-10-CM | POA: Diagnosis not present

## 2023-01-08 DIAGNOSIS — Z720 Tobacco use: Secondary | ICD-10-CM | POA: Diagnosis not present

## 2023-01-14 DIAGNOSIS — I10 Essential (primary) hypertension: Secondary | ICD-10-CM | POA: Insufficient documentation

## 2023-01-14 DIAGNOSIS — E785 Hyperlipidemia, unspecified: Secondary | ICD-10-CM | POA: Insufficient documentation

## 2023-01-16 DIAGNOSIS — R5383 Other fatigue: Secondary | ICD-10-CM | POA: Insufficient documentation

## 2023-01-16 DIAGNOSIS — E785 Hyperlipidemia, unspecified: Secondary | ICD-10-CM | POA: Diagnosis not present

## 2023-01-16 DIAGNOSIS — R0683 Snoring: Secondary | ICD-10-CM | POA: Diagnosis not present

## 2023-01-16 DIAGNOSIS — I10 Essential (primary) hypertension: Secondary | ICD-10-CM | POA: Diagnosis not present

## 2023-01-16 DIAGNOSIS — Z87891 Personal history of nicotine dependence: Secondary | ICD-10-CM | POA: Diagnosis not present

## 2023-01-16 DIAGNOSIS — Z136 Encounter for screening for cardiovascular disorders: Secondary | ICD-10-CM | POA: Diagnosis not present

## 2023-01-22 ENCOUNTER — Encounter: Payer: Self-pay | Admitting: Dermatology

## 2023-01-22 ENCOUNTER — Ambulatory Visit (INDEPENDENT_AMBULATORY_CARE_PROVIDER_SITE_OTHER): Payer: Medicaid Other | Admitting: Dermatology

## 2023-01-22 VITALS — BP 103/66 | HR 69

## 2023-01-22 DIAGNOSIS — L573 Poikiloderma of Civatte: Secondary | ICD-10-CM | POA: Diagnosis not present

## 2023-01-22 DIAGNOSIS — L816 Other disorders of diminished melanin formation: Secondary | ICD-10-CM

## 2023-01-22 NOTE — Patient Instructions (Signed)
Wear sunscreen daily.  Recommend IPL laser for best treatment results.   Counseling for BBL / IPL / Laser and Coordination of Care Discussed the treatment option of Broad Band Light (BBL) /Intense Pulsed Light (IPL)/ Laser for skin discoloration, including brown spots and redness.  Typically we recommend at least 1-3 treatment sessions about 5-8 weeks apart for best results.  Cannot have tanned skin when BBL performed, and regular use of sunscreen is advised after the procedure to help maintain results. The patient's condition may also require "maintenance treatments" in the future.  The fee for BBL / laser treatments is $350 per treatment session for the whole face.  A fee can be quoted for other parts of the body.  Insurance typically does not pay for BBL/laser treatments and therefore the fee is an out-of-pocket cost.     Due to recent changes in healthcare laws, you may see results of your pathology and/or laboratory studies on MyChart before the doctors have had a chance to review them. We understand that in some cases there may be results that are confusing or concerning to you. Please understand that not all results are received at the same time and often the doctors may need to interpret multiple results in order to provide you with the best plan of care or course of treatment. Therefore, we ask that you please give [place] 2 business days to thoroughly review all your results before contacting the office for clarification. Should we see a critical lab result, you will be contacted sooner.   If You Need Anything After Your Visit  If you have any questions or concerns for your doctor, please call our main line at [phone] If no one answers, please leave a voicemail as directed and we will return your call as soon as possible. Messages left after 4 pm will be answered the following business day.   You may also send [place] a message via MyChart. We typically respond to MyChart messages within 1-2  business days.  For prescription refills, please ask your pharmacy to contact our office. Our fax number is [phone].  If you have an urgent issue when the clinic is closed that cannot wait until the next business day, you can page your doctor at the number below.    Please note that while we do our best to be available for urgent issues outside of office hours, we are not available 24/7.   If you have an urgent issue and are unable to reach [place], you may choose to seek medical care at your doctor's office, retail clinic, urgent care center, or emergency room.  If you have a medical emergency, please immediately call 911 or go to the emergency department. In the event of inclement weather, please call our main line at [phone] for an update on the status of any delays or closures.  Dermatology Medication Tips: Please keep the boxes that topical medications come in in order to help keep track of the instructions about where and how to use these. Pharmacies typically print the medication instructions only on the boxes and not directly on the medication tubes.   If your medication is too expensive, please contact our office at [phone] or send [place] a message through MyChart.   We are unable to tell what your co-pay for medications will be in advance as this is different depending on your insurance coverage. However, we may be able to find a substitute medication at lower cost or fill out paperwork to  get insurance to cover a needed medication.   If a prior authorization is required to get your medication covered by your insurance company, please allow [place] 1-2 business days to complete this process.  Drug prices often vary depending on where the prescription is filled and some pharmacies may offer cheaper prices.  The website www.goodrx.com contains coupons for medications through different pharmacies. The prices here do not account for what the cost may be with help from insurance (it may  be cheaper with your insurance), but the website can give you the price if you did not use any insurance.  - You can print the associated coupon and take it with your prescription to the pharmacy.  - You may also stop by our office during regular business hours and pick up a GoodRx coupon card.  - If you need your prescription sent electronically to a different pharmacy, notify our office through [facility] or by phone at [phone]

## 2023-01-22 NOTE — Progress Notes (Signed)
   New Patient Visit   Subjective  [name] Silva is a 47 y.o. female who presents for the following: Rash on chest and neck. Dur: couple of years. Non tender, denies itching, burning, stinging. No Rx treatments. No OTC topicals. Always there, never goes away.   The following portions of the chart were reviewed this encounter and updated as appropriate: medications, allergies, medical history  Review of Systems:  No other skin or systemic complaints except as noted in HPI or Assessment and Plan.  Objective  Well appearing patient in no apparent distress; mood and affect are within normal limits.  A focused examination was performed of the following areas: Chest, neck  Relevant exam findings are noted in the Assessment and Plan.    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 Condition is symptomatic/ bothersome to patient. Not currently at goal.   Treatment Plan: Wear sunscreen daily.  Recommend IPL laser for best treatment results.   Counseling for BBL / IPL / Laser and Coordination of Care Discussed the treatment option of Broad Band Light (BBL) /Intense Pulsed Light (IPL)/ Laser for skin discoloration, including brown spots and redness.  Typically we recommend at least 1-3 treatment sessions about 5-8 weeks apart for best results.  Cannot have tanned skin when BBL performed, and regular use of sunscreen is advised after the procedure to help maintain results. The patient's condition may also require "maintenance treatments" in the future.  The fee for BBL / laser treatments is $350 per treatment session for the whole face.  A fee can be quoted for other parts of the body.  Insurance typically does not pay for BBL/laser treatments and therefore the fee is an out-of-pocket cost.    Return for TBSE next  available.  I, Lawson Radar, CMA, am acting as scribe for [employer], DO.   Documentation: I have reviewed the above documentation for accuracy and completeness, and I agree with the above.  Langston Reusing, DO

## 2023-01-23 ENCOUNTER — Encounter: Payer: Self-pay | Admitting: Dermatology

## 2023-01-27 ENCOUNTER — Encounter: Payer: Self-pay | Admitting: Family Medicine

## 2023-02-09 DIAGNOSIS — G4710 Hypersomnia, unspecified: Secondary | ICD-10-CM | POA: Diagnosis not present

## 2023-02-09 DIAGNOSIS — Z6841 Body Mass Index (BMI) 40.0 and over, adult: Secondary | ICD-10-CM | POA: Diagnosis not present

## 2023-02-09 DIAGNOSIS — F17210 Nicotine dependence, cigarettes, uncomplicated: Secondary | ICD-10-CM | POA: Diagnosis not present

## 2023-02-16 DIAGNOSIS — Z7189 Other specified counseling: Secondary | ICD-10-CM | POA: Diagnosis not present

## 2023-02-16 DIAGNOSIS — F54 Psychological and behavioral factors associated with disorders or diseases classified elsewhere: Secondary | ICD-10-CM | POA: Diagnosis not present

## 2023-03-02 ENCOUNTER — Ambulatory Visit: Payer: Medicaid Other | Admitting: Family Medicine

## 2023-03-02 ENCOUNTER — Encounter: Payer: Self-pay | Admitting: Family Medicine

## 2023-03-02 VITALS — BP 119/74 | HR 66 | Ht 66.0 in | Wt 295.0 lb

## 2023-03-02 DIAGNOSIS — R42 Dizziness and giddiness: Secondary | ICD-10-CM | POA: Diagnosis not present

## 2023-03-02 MED ORDER — MECLIZINE HCL 12.5 MG PO TABS: 12.5000 mg | ORAL_TABLET | Freq: Three times a day (TID) | ORAL | 1 refills | Status: DC | PRN

## 2023-03-02 NOTE — Patient Instructions (Signed)
        Great to see you today.   - Please take medications as prescribed. - Follow up with your primary health provider if any health concerns arises. - If symptoms worsen please contact your primary care provider and/or visit the emergency department.  

## 2023-03-02 NOTE — Progress Notes (Signed)
New Patient Office Visit   Subjective   Patient ID: [name] Silva, female    DOB: 1976/01/01  Age: 47 y.o. MRN: [id number]  CC:  Chief Complaint  Patient presents with   Establish Care    Patient is here to establish care. Wants to discuss recent vertigo. Also is seeing a provider for bariatric surgery and wants support letter signed.     HPI [name] Silva 47 year old female, presents to establish care. She  has a past medical history of Anxiety, Carpal tunnel syndrome of left wrist (06/2016), Dental crown present, Obesity, Sleep apnea, Smoker, and Wears glasses.  Dizziness This is a new problem. The current episode started in the past 7 days. Dizziness occurs intermittently and every morning and  has been waxing and waning since onset. Associated symptoms include nausea and vertigo. Pertinent negatives include no chest pain, diaphoresis, fever, numbness, rash, sore throat or visual change. Exacerbated by: movement. She has tried nothing for the symptoms.       Outpatient Encounter Medications as of 03/02/2023  Medication Sig   amLODipine (NORVASC) 10 MG tablet Take 1 tablet (10 mg total) by mouth daily.   Cholecalciferol (VITAMIN D) 50 MCG (2000 UT) CAPS Take 1 capsule by mouth daily.   meclizine (ANTIVERT) 12.5 MG tablet Take 1 tablet (12.5 mg total) by mouth 3 (three) times daily as needed for dizziness.   [DISCONTINUED] ALPRAZolam (XANAX) 0.5 MG tablet TAKE 1/2 TO 1 (ONE-HALF TO ONE) TABLET BY MOUTH THREE TIMES DAILY AS NEEDED   [DISCONTINUED] citalopram (CELEXA) 10 MG tablet Take 1 tablet (10 mg total) by mouth daily.   [DISCONTINUED] megestrol (MEGACE) 40 MG tablet 24 days on, 4 days   [DISCONTINUED] mupirocin cream (BACTROBAN) 2 % Apply three times daily to affected areas of skin x 7 days (ie recurrent early boils/pustules).   [DISCONTINUED] mupirocin ointment (BACTROBAN) 2 % Apply 1 Application topically 3 (three) times daily. Apply 3 times daily to affected areas x 7 days  ie:(recurrent boil/skin infections)   [DISCONTINUED] solifenacin (VESICARE) 10 MG tablet Take 1 tablet (10 mg total) by mouth daily.   No facility-administered encounter medications on file as of 03/02/2023.    Past Surgical History:  Procedure Laterality Date   BILATERAL SALPINGECTOMY Bilateral 01/09/2017   Procedure: LAPAROSCOPIC SALPINGECTOMY;  Surgeon: Lavina Hamman, MD;  Location: [facility];  Service: Gynecology;  Laterality: Bilateral;   CARPAL TUNNEL RELEASE Left 07/08/2016   Procedure: CARPAL TUNNEL RELEASE, left;  Surgeon: Cindee Salt, MD;  Location: [facility];  Service: Orthopedics;  Laterality: Left;  FAB   CARPAL TUNNEL RELEASE Right 07/10/2017   CESAREAN SECTION  04/10/2006    Review of Systems  Constitutional:  Negative for chills and fever.  Eyes:  Negative for blurred vision.  Respiratory:  Negative for shortness of breath.   Cardiovascular:  Negative for chest pain.  Gastrointestinal:  Positive for nausea. Negative for abdominal pain and vomiting.  Neurological:  Positive for headaches.      Objective    BP 119/74   Pulse 66   Ht 5\' 6"  (1.676 m)   Wt 295 lb (133.8 kg)   SpO2 94%   BMI 47.61 kg/m   Physical Exam Vitals reviewed.  Constitutional:      General: She is not in acute distress.    Appearance: Normal appearance. She is not ill-appearing, toxic-appearing or diaphoretic.  HENT:     Head: Normocephalic.     Right Ear: Tympanic membrane  normal. There is impacted cerumen.     Left Ear: Tympanic membrane normal.     Nose: Nose normal.     Mouth/Throat:     Mouth: Mucous membranes are moist.  Eyes:     General:        Right eye: No discharge.        Left eye: No discharge.     Extraocular Movements: Extraocular movements intact.     Conjunctiva/sclera: Conjunctivae normal.     Pupils: Pupils are equal, round, and reactive to light.  Cardiovascular:     Pulses: Normal pulses.     Heart sounds: Normal heart sounds.  Pulmonary:      Effort: Pulmonary effort is normal. No respiratory distress.     Breath sounds: Normal breath sounds.  Abdominal:     General: Bowel sounds are normal.     Palpations: Abdomen is soft.     Tenderness: There is no abdominal tenderness. There is no guarding.  Musculoskeletal:        General: Normal range of motion.     Cervical back: Normal range of motion.  Skin:    General: Skin is warm and dry.     Capillary Refill: Capillary refill takes less than 2 seconds.  Neurological:     General: No focal deficit present.     Mental Status: She is alert and oriented to person, place, and time.     Coordination: Coordination normal.     Gait: Gait normal.  Psychiatric:        Mood and Affect: Mood normal.        Behavior: Behavior normal.       Assessment & Plan:  Vertigo Assessment & Plan: New onset started last week. Pt has not loss consciousness or fainted  Possibility due low Vit D or Vit B 12 advise to continue   supplements   Discussed to increase fluid intake.Dehydration is a common cause of dizziness. Make sure to drink plenty of fluids throughout the day. Avoid Sudden Movements: Stand Up Slowly: When moving from a lying or sitting position to standing, do so slowly to avoid sudden drops in blood pressure. Change Positions Gradually: Move slowly and steadily to prevent dizziness from positional changes. Started Meclizine 12.5 mg PRN  Referral placed to ENT for further evaluation.   Orders: -     Ambulatory referral to ENT  Other orders -     Meclizine HCl; Take 1 tablet (12.5 mg total) by mouth 3 (three) times daily as needed for dizziness.  Dispense: 30 tablet; Refill: 1    Return in about 6 months (around 09/02/2023), or if symptoms worsen or fail to improve, for hyperlipidemia, routine labs, pre-diabetes.   Cruzita Lederer Newman Nip, FNP

## 2023-03-02 NOTE — Assessment & Plan Note (Signed)
New onset started last week. Pt has not loss consciousness or fainted  Possibility due low Vit D or Vit B 12 advise to continue   supplements   Discussed to increase fluid intake.Dehydration is a common cause of dizziness. Make sure to drink plenty of fluids throughout the day. Avoid Sudden Movements: Stand Up Slowly: When moving from a lying or sitting position to standing, do so slowly to avoid sudden drops in blood pressure. Change Positions Gradually: Move slowly and steadily to prevent dizziness from positional changes. Started Meclizine 12.5 mg PRN  Referral placed to ENT for further evaluation.

## 2023-03-03 DIAGNOSIS — I10 Essential (primary) hypertension: Secondary | ICD-10-CM | POA: Diagnosis not present

## 2023-03-03 DIAGNOSIS — E785 Hyperlipidemia, unspecified: Secondary | ICD-10-CM | POA: Diagnosis not present

## 2023-03-03 DIAGNOSIS — Z87891 Personal history of nicotine dependence: Secondary | ICD-10-CM | POA: Diagnosis not present

## 2023-03-04 ENCOUNTER — Encounter: Payer: Self-pay | Admitting: Obstetrics & Gynecology

## 2023-03-04 ENCOUNTER — Encounter: Payer: Self-pay | Admitting: Family Medicine

## 2023-03-11 ENCOUNTER — Other Ambulatory Visit: Payer: Self-pay

## 2023-03-11 DIAGNOSIS — R42 Dizziness and giddiness: Secondary | ICD-10-CM

## 2023-03-12 ENCOUNTER — Encounter: Payer: Self-pay | Admitting: *Deleted

## 2023-03-12 ENCOUNTER — Encounter: Payer: Self-pay | Admitting: Family Medicine

## 2023-03-12 ENCOUNTER — Telehealth (INDEPENDENT_AMBULATORY_CARE_PROVIDER_SITE_OTHER): Payer: Medicaid Other | Admitting: Family Medicine

## 2023-03-12 VITALS — BP 128/73 | Ht 66.0 in | Wt 294.0 lb

## 2023-03-12 DIAGNOSIS — Z6841 Body Mass Index (BMI) 40.0 and over, adult: Secondary | ICD-10-CM

## 2023-03-12 DIAGNOSIS — I10 Essential (primary) hypertension: Secondary | ICD-10-CM

## 2023-03-12 NOTE — Progress Notes (Signed)
Start time: 3:31 End time: 4:03  Virtual Visit via Video Note  I connected with [name] Silva on 03/12/23 by a video enabled telemedicine application and verified that I am speaking with the correct person using two identifiers.  Location: Patient: home Provider: office   I discussed the limitations of evaluation and management by telemedicine and the availability of in person appointments. The patient expressed understanding and agreed to proceed.  History of Present Illness:  Chief Complaint  Patient presents with   Form Completion    Patient needs form filled out for bariatric surgery. Pre op is 8/13.   Patient is requesting a form to be completed as part of evaluation prior to bariatric surgery. She has been seeing [facility] since May.  She reports her weight got down to 287#, but fluctuates up and down.  She started using MyFitnessPal App since early June. She cut out sweet tea, is drinking more water. She started tracking protein in anticipation of surgery. Wt fluctuates from 287-296.  She tapered off Citalopram (stayed at 5 and 2.5 mg dose for 3 weeks each, off entirely x 4 weeks). Denies any recurrent anxiety.  She quit smoking on 7/18, and initially had some irritability. She reports this has improved. She quit smoking cold [place], as this is required for her surgery.  Her boyfriend still smokes.  Patient under my care since 05/2011, started counseling re: diet/exercise/weight loss since CPE 08/2012. Was at her lowest weight in 2019, when had been going to the gym regularly, eating better (262# 6.4 oz in 11/2017).   She had regained weight--eating more fast foods, more snacking, mindless eating, and no exercise.  She had never wanted to try medications. Finally, at a physical in 07/2021 she asked about phentermine (a friend had good results with it), but she wasn't a candidate for this due to her hypertension. We discussed diet in detail--high protein low carb,  healthy fast food choices, meal prep, portions.  Discussed [employer], using tracking apps such as MFP or LoseIt. Discussed [facility].  She never tried these programs. She recalls following the Atkins diet at one point in the past.  Only recently started MFP (per bariatric clinic recommendation).  She states her insurance [employer]) doesn't cover other weight loss medications, including Wegovy, Zepbound.  PMH, PSH, SH reviewed  Hyperlipidemia:  not on meds.  Last check: Lab Results  Component Value Date   CHOL 225 (H) 09/30/2022   HDL 55 09/30/2022   LDLCALC 155 (H) 09/30/2022   TRIG 87 09/30/2022   CHOLHDL 4.1 09/30/2022  Checked more recently at [facility], on 01/16/23: TC 242, LDL 166. A1c 5.7, vitamin D 28.5 B12 low at 227  Outpatient Encounter Medications as of 03/12/2023  Medication Sig Note   amLODipine (NORVASC) 10 MG tablet Take 1 tablet (10 mg total) by mouth daily.    Cholecalciferol (VITAMIN D) 50 MCG (2000 UT) CAPS Take 1 capsule by mouth daily.    meclizine (ANTIVERT) 12.5 MG tablet Take 1 tablet (12.5 mg total) by mouth 3 (three) times daily as needed for dizziness. (Patient not taking: Reported on 03/12/2023) 03/12/2023: As needed, has not taken in the last few days   No facility-administered encounter medications on file as of 03/12/2023.   Allergies  Allergen Reactions   Amoxicillin-Pot Clavulanate Rash   ROS:  weight per HPI.  She isn't having other concerns (no chest pain, SOB, no pains or reasons she can't exercise). Moods have been good since tapering off  citalopram, just some initial irritability after quitting smoking. Denies nicotine cravings.     Observations/Objective:  BP 128/73   Ht 5\' 6"  (1.676 m)   Wt 294 lb (133.4 kg)   LMP 02/25/2023 (Exact Date)   BMI 47.45 kg/m   Pleasant female, in good spirits. She is alert and oriented.   Cranial nerves grossly intact, normal eye contact and speech. Normal mood, affect, grooming.   Assessment and Plan:  Morbid  obesity with BMI of 45.0-49.9, adult (HCC) - under care of bariatric clinic, with plans for surgery. Discussed risks of weight regain even with surgery, discussed meds but not covered. FFO  Essential hypertension - controlled  Congratulated on smoking cessation. She has tapered off citalopram and so far is doing well without any recurrent anxiety. Briefly discussed wellbutrin as an option if she has recurrent cravings or worsening irritability.  Counseled re: potential risks for regain with surgery, importance of compliance. Thinks weight loss meds aren't an option, on Medicaid  Form completed and will be faxed to [facility].  [facility] labs from June reviewed after visit--lipids were worse, vitamin D and B12 levels were low.  Will ensure that she is aware of these results (unsure if she was given an injection, not taking supplements).  Patient has transferred care to a provider in [place]. She is welcome to return here in the future, if desired.   Follow Up Instructions:    I discussed the assessment and treatment plan with the patient. The patient was provided an opportunity to ask questions and all were answered. The patient agreed with the plan and demonstrated an understanding of the instructions.   The patient was advised to call back or seek an in-person evaluation if the symptoms worsen or if the condition fails to improve as anticipated.  I spent 35 minutes dedicated to the care of this patient, including pre-visit review of records, face to face time, post-visit ordering of testing and documentation.    Lavonda Jumbo, MD

## 2023-03-18 DIAGNOSIS — G4733 Obstructive sleep apnea (adult) (pediatric): Secondary | ICD-10-CM | POA: Diagnosis not present

## 2023-03-24 DIAGNOSIS — Z6841 Body Mass Index (BMI) 40.0 and over, adult: Secondary | ICD-10-CM | POA: Diagnosis not present

## 2023-03-24 DIAGNOSIS — E669 Obesity, unspecified: Secondary | ICD-10-CM | POA: Diagnosis not present

## 2023-03-24 DIAGNOSIS — G4733 Obstructive sleep apnea (adult) (pediatric): Secondary | ICD-10-CM | POA: Diagnosis not present

## 2023-03-24 DIAGNOSIS — F17210 Nicotine dependence, cigarettes, uncomplicated: Secondary | ICD-10-CM | POA: Diagnosis not present

## 2023-03-25 ENCOUNTER — Telehealth: Payer: Self-pay | Admitting: Family Medicine

## 2023-03-25 ENCOUNTER — Ambulatory Visit: Payer: Medicaid Other | Admitting: Family Medicine

## 2023-03-25 NOTE — Telephone Encounter (Signed)
Okay; thanks.

## 2023-03-25 NOTE — Telephone Encounter (Signed)
Shirlee Limerick notified me that this pt will need to see cardiology before [facility] will accept the referral for vertigo. Referral cancelled for now

## 2023-03-30 ENCOUNTER — Ambulatory Visit: Payer: Medicaid Other | Attending: Family Medicine | Admitting: Audiologist

## 2023-04-01 ENCOUNTER — Encounter: Payer: Medicaid Other | Admitting: Family Medicine

## 2023-04-02 DIAGNOSIS — I10 Essential (primary) hypertension: Secondary | ICD-10-CM | POA: Diagnosis not present

## 2023-04-02 DIAGNOSIS — E785 Hyperlipidemia, unspecified: Secondary | ICD-10-CM | POA: Diagnosis not present

## 2023-04-02 DIAGNOSIS — Z87891 Personal history of nicotine dependence: Secondary | ICD-10-CM | POA: Diagnosis not present

## 2023-04-02 DIAGNOSIS — Z720 Tobacco use: Secondary | ICD-10-CM | POA: Diagnosis not present

## 2023-04-29 DIAGNOSIS — Z881 Allergy status to other antibiotic agents status: Secondary | ICD-10-CM | POA: Diagnosis not present

## 2023-04-29 DIAGNOSIS — Z6841 Body Mass Index (BMI) 40.0 and over, adult: Secondary | ICD-10-CM | POA: Diagnosis not present

## 2023-04-29 DIAGNOSIS — Z79899 Other long term (current) drug therapy: Secondary | ICD-10-CM | POA: Diagnosis not present

## 2023-04-29 DIAGNOSIS — Z87891 Personal history of nicotine dependence: Secondary | ICD-10-CM | POA: Diagnosis not present

## 2023-04-29 DIAGNOSIS — E785 Hyperlipidemia, unspecified: Secondary | ICD-10-CM | POA: Diagnosis not present

## 2023-04-29 DIAGNOSIS — I10 Essential (primary) hypertension: Secondary | ICD-10-CM | POA: Diagnosis not present

## 2023-04-29 DIAGNOSIS — K2950 Unspecified chronic gastritis without bleeding: Secondary | ICD-10-CM | POA: Diagnosis not present

## 2023-05-05 DIAGNOSIS — R319 Hematuria, unspecified: Secondary | ICD-10-CM | POA: Insufficient documentation

## 2023-05-15 ENCOUNTER — Encounter: Payer: Self-pay | Admitting: Family Medicine

## 2023-05-15 ENCOUNTER — Ambulatory Visit: Payer: Medicaid Other | Admitting: Family Medicine

## 2023-05-15 VITALS — BP 116/75 | HR 68 | Ht 65.0 in | Wt 278.0 lb

## 2023-05-15 DIAGNOSIS — D509 Iron deficiency anemia, unspecified: Secondary | ICD-10-CM | POA: Diagnosis not present

## 2023-05-15 DIAGNOSIS — Z9884 Bariatric surgery status: Secondary | ICD-10-CM | POA: Diagnosis not present

## 2023-05-15 DIAGNOSIS — Z6841 Body Mass Index (BMI) 40.0 and over, adult: Secondary | ICD-10-CM | POA: Diagnosis not present

## 2023-05-15 DIAGNOSIS — E46 Unspecified protein-calorie malnutrition: Secondary | ICD-10-CM | POA: Diagnosis not present

## 2023-05-15 DIAGNOSIS — R7301 Impaired fasting glucose: Secondary | ICD-10-CM

## 2023-05-15 NOTE — Patient Instructions (Addendum)
        Great to see you today.  I have refilled the medication(s) we provide.    Follow Dietary Guidelines  Eat Small, Frequent Meals: Your stomach will have a much smaller capacity, so focus on small, frequent meals throughout the day.  Chew Thoroughly: Chew your food slowly and thoroughly to help digestion and avoid discomfort.  Focus on Protein: Prioritize high-protein foods to support muscle mass and healing. Aim for 60-80 grams of protein daily   Stay Hydrated: Drink plenty of water throughout the day, but avoid drinking during meals to prevent overfilling your stomach.   Drink fluids at least 30 minutes before or after meals.  Avoid Sugary and Carbonated Drinks: These can cause discomfort and impede weight loss.  Follow a Supplement Routine: Post-surgery, it's essential to take prescribed vitamin and mineral supplements daily, including multivitamins, calcium, Vitamin D, iron, and Vitamin B12, to prevent deficiencies.     If labs were collected, we will inform you of lab results once received either by echart message or telephone call.   - echart message- for normal results that have been seen by the patient already.   - telephone call: abnormal results or if patient has not viewed results in their echart.   - Please take medications as prescribed. - Follow up with your primary health provider if any health concerns arises. - If symptoms worsen please contact your primary care provider and/or visit the emergency department.

## 2023-05-15 NOTE — Progress Notes (Signed)
Patient Office Visit   Subjective   Patient ID: [name] Silva, female    DOB: [birth year]  Age: 47 y.o. MRN: [id number]  CC:  Chief Complaint  Patient presents with   Follow-up    Patient had bariatric surgery on 9/18, is here for f/u.     HPI [name] Silva 47 year old female, presents to the clinic for bariatric surgery follow up. She  has a past medical history of Anxiety, Carpal tunnel syndrome of left wrist (06/2016), Dental crown present, Obesity, Sleep apnea, Smoker, and Wears glasses.For the details of today's visit, please refer to assessment and plan.   HPI   Outpatient Encounter Medications as of 05/15/2023  Medication Sig   ALPRAZolam (XANAX) 0.5 MG tablet 3 (three) times a day as needed for Anxiety.   Cholecalciferol (VITAMIN D) 50 MCG (2000 UT) CAPS Take 1 capsule by mouth daily.   citalopram (CELEXA) 10 MG tablet every evening.   meclizine (ANTIVERT) 12.5 MG tablet Take 1 tablet (12.5 mg total) by mouth 3 (three) times daily as needed for dizziness.   amLODipine (NORVASC) 10 MG tablet Take 1 tablet (10 mg total) by mouth daily.   No facility-administered encounter medications on file as of 05/15/2023.    Past Surgical History:  Procedure Laterality Date   BILATERAL SALPINGECTOMY Bilateral 01/09/2017   Procedure: LAPAROSCOPIC SALPINGECTOMY;  Surgeon: Lavina Hamman, MD;  Location: [facility];  Service: Gynecology;  Laterality: Bilateral;   CARPAL TUNNEL RELEASE Left 07/08/2016   Procedure: CARPAL TUNNEL RELEASE, left;  Surgeon: Cindee Salt, MD;  Location: [facility];  Service: Orthopedics;  Laterality: Left;  FAB   CARPAL TUNNEL RELEASE Right 07/10/2017   CESAREAN SECTION  04/10/2006    Review of Systems  Constitutional:  Negative for chills and fever.  Eyes:  Negative for blurred vision.  Respiratory:  Negative for shortness of breath.   Cardiovascular:  Negative for chest pain.  Gastrointestinal:  Negative for abdominal pain, blood in stool,  constipation, diarrhea, melena, nausea and vomiting.  Genitourinary:  Negative for dysuria and urgency.  Skin:  Negative for rash.  Neurological:  Negative for dizziness and headaches.      Objective    BP 116/75   Pulse 68   Ht 5\' 5"  (1.651 m)   Wt 278 lb (126.1 kg)   SpO2 92%   BMI 46.26 kg/m   Physical Exam Vitals reviewed.  Constitutional:      General: She is not in acute distress.    Appearance: Normal appearance. She is obese. She is not ill-appearing, toxic-appearing or diaphoretic.  HENT:     Head: Normocephalic.  Eyes:     General:        Right eye: No discharge.        Left eye: No discharge.     Conjunctiva/sclera: Conjunctivae normal.  Cardiovascular:     Rate and Rhythm: Normal rate.     Pulses: Normal pulses.     Heart sounds: Normal heart sounds.  Pulmonary:     Effort: Pulmonary effort is normal. No respiratory distress.     Breath sounds: Normal breath sounds.  Abdominal:     General: Bowel sounds are normal.     Palpations: Abdomen is soft.     Tenderness: There is no abdominal tenderness. There is no right CVA tenderness, left CVA tenderness or guarding.  Musculoskeletal:        General: Normal range of motion.     Cervical back:  Normal range of motion.  Skin:    General: Skin is warm and dry.     Capillary Refill: Capillary refill takes less than 2 seconds.  Neurological:     Mental Status: She is alert.     Coordination: Coordination normal.     Gait: Gait normal.  Psychiatric:        Mood and Affect: Mood normal.        Behavior: Behavior normal.       Assessment & Plan:  S/P bariatric surgery Assessment & Plan: Surgery on 04/29/2023 Patient reports tolerated surgery well with no new symptoms Labs ordered in today's visit, which will be drawn in November Advise patient to follow up for any new alarming symptoms Discussed To Follow Dietary Guidelines: Eat Small, Frequent Meals: Your stomach will have a much smaller capacity, so focus  on small, frequent meals throughout the day. Chew Thoroughly: Chew your food slowly and thoroughly to help digestion and avoid discomfort. Focus on Protein: Prioritize high-protein foods to support muscle mass and healing. Aim for 60-80 grams of protein daily. Stay Hydrated: Drink plenty of water throughout the day, but avoid drinking during meals to prevent overfilling your stomach. Drink fluids at least 30 minutes before or after meals. Avoid Sugary and Carbonated Drinks: These can cause discomfort and impede weight loss. Follow a Supplement Routine: Post-surgery, it's essential to take prescribed vitamin and mineral supplements daily, including multivitamins, calcium, Vitamin D, iron, and Vitamin B12, to prevent deficiencies.   Impaired fasting glucose -     Hemoglobin A1c  Iron deficiency anemia, unspecified iron deficiency anemia type -     Vitamin B12 -     Iron, TIBC and Ferritin Panel  Protein-calorie malnutrition, unspecified severity (HCC) -     Vitamin B12 -     CBC with Differential/Platelet -     VITAMIN D 25 Hydroxy (Vit-D Deficiency, Fractures) -     Vitamin B1 -     Lipid panel -     Magnesium  Morbid obesity with BMI of 45.0-49.9, adult (HCC) -     CMP14+EGFR    Return in about 4 months (around 09/15/2023), or if symptoms worsen or fail to improve, for chronic follow-up.   Cruzita Lederer Newman Nip, FNP

## 2023-05-15 NOTE — Assessment & Plan Note (Addendum)
Surgery on 04/29/2023 Patient reports tolerated surgery well with no new symptoms Labs ordered in today's visit, which will be drawn in November Advise patient to follow up for any new alarming symptoms Discussed To Follow Dietary Guidelines: Eat Small, Frequent Meals: Your stomach will have a much smaller capacity, so focus on small, frequent meals throughout the day. Chew Thoroughly: Chew your food slowly and thoroughly to help digestion and avoid discomfort. Focus on Protein: Prioritize high-protein foods to support muscle mass and healing. Aim for 60-80 grams of protein daily. Stay Hydrated: Drink plenty of water throughout the day, but avoid drinking during meals to prevent overfilling your stomach. Drink fluids at least 30 minutes before or after meals. Avoid Sugary and Carbonated Drinks: These can cause discomfort and impede weight loss. Follow a Supplement Routine: Post-surgery, it's essential to take prescribed vitamin and mineral supplements daily, including multivitamins, calcium, Vitamin D, iron, and Vitamin B12, to prevent deficiencies.

## 2023-07-03 ENCOUNTER — Telehealth: Payer: Self-pay | Admitting: Family Medicine

## 2023-07-03 NOTE — Telephone Encounter (Signed)
Patient did her flu shot through [employer] [facility]

## 2023-07-03 NOTE — Telephone Encounter (Signed)
Updated record.

## 2023-07-07 ENCOUNTER — Inpatient Hospital Stay: Payer: Medicaid Other | Admitting: Family Medicine

## 2023-07-21 DIAGNOSIS — Z6841 Body Mass Index (BMI) 40.0 and over, adult: Secondary | ICD-10-CM | POA: Diagnosis not present

## 2023-07-21 DIAGNOSIS — E46 Unspecified protein-calorie malnutrition: Secondary | ICD-10-CM | POA: Diagnosis not present

## 2023-07-21 DIAGNOSIS — D509 Iron deficiency anemia, unspecified: Secondary | ICD-10-CM | POA: Diagnosis not present

## 2023-07-21 DIAGNOSIS — R7301 Impaired fasting glucose: Secondary | ICD-10-CM | POA: Diagnosis not present

## 2023-07-25 LAB — CBC WITH DIFFERENTIAL/PLATELET
Basophils Absolute: 0 10*3/uL (ref 0.0–0.2)
Basos: 0 %
EOS (ABSOLUTE): 0.2 10*3/uL (ref 0.0–0.4)
Eos: 3 %
Hematocrit: 47.5 % — ABNORMAL HIGH (ref 34.0–46.6)
Hemoglobin: 15.3 g/dL (ref 11.1–15.9)
Immature Grans (Abs): 0 10*3/uL (ref 0.0–0.1)
Immature Granulocytes: 0 %
Lymphocytes Absolute: 2.2 10*3/uL (ref 0.7–3.1)
Lymphs: 32 %
MCH: 32.1 pg (ref 26.6–33.0)
MCHC: 32.2 g/dL (ref 31.5–35.7)
MCV: 100 fL — ABNORMAL HIGH (ref 79–97)
Monocytes Absolute: 0.5 10*3/uL (ref 0.1–0.9)
Monocytes: 8 %
Neutrophils Absolute: 3.9 10*3/uL (ref 1.4–7.0)
Neutrophils: 57 %
Platelets: 287 10*3/uL (ref 150–450)
RBC: 4.76 x10E6/uL (ref 3.77–5.28)
RDW: 12.7 % (ref 11.7–15.4)
WBC: 6.8 10*3/uL (ref 3.4–10.8)

## 2023-07-25 LAB — CMP14+EGFR
ALT: 13 [IU]/L (ref 0–32)
AST: 22 [IU]/L (ref 0–40)
Albumin: 4.3 g/dL (ref 3.9–4.9)
Alkaline Phosphatase: 115 [IU]/L (ref 44–121)
BUN/Creatinine Ratio: 20 (ref 9–23)
BUN: 17 mg/dL (ref 6–24)
Bilirubin Total: 0.3 mg/dL (ref 0.0–1.2)
CO2: 22 mmol/L (ref 20–29)
Calcium: 9.9 mg/dL (ref 8.7–10.2)
Chloride: 100 mmol/L (ref 96–106)
Creatinine, Ser: 0.86 mg/dL (ref 0.57–1.00)
Globulin, Total: 2.8 g/dL (ref 1.5–4.5)
Glucose: 85 mg/dL (ref 70–99)
Potassium: 4.1 mmol/L (ref 3.5–5.2)
Sodium: 141 mmol/L (ref 134–144)
Total Protein: 7.1 g/dL (ref 6.0–8.5)
eGFR: 85 mL/min/{1.73_m2} (ref >59)

## 2023-07-25 LAB — IRON,TIBC AND FERRITIN PANEL
Ferritin: 80 ng/mL (ref 15–150)
Iron Saturation: 20 % (ref 15–55)
Iron: 60 ug/dL (ref 27–159)
Total Iron Binding Capacity: 299 ug/dL (ref 250–450)
UIBC: 239 ug/dL (ref 131–425)

## 2023-07-25 LAB — LIPID PANEL
Chol/HDL Ratio: 5 {ratio} — ABNORMAL HIGH (ref 0.0–4.4)
Cholesterol, Total: 247 mg/dL — ABNORMAL HIGH (ref 100–199)
HDL: 49 mg/dL (ref >39)
LDL Chol Calc (NIH): 176 mg/dL — ABNORMAL HIGH (ref 0–99)
Triglycerides: 121 mg/dL (ref 0–149)
VLDL Cholesterol Cal: 22 mg/dL (ref 5–40)

## 2023-07-25 LAB — VITAMIN B12: Vitamin B-12: 511 pg/mL (ref 232–1245)

## 2023-07-25 LAB — HEMOGLOBIN A1C
Est. average glucose Bld gHb Est-mCnc: 114 mg/dL
Hgb A1c MFr Bld: 5.6 % (ref 4.8–5.6)

## 2023-07-25 LAB — VITAMIN B1: Thiamine: 150.1 nmol/L (ref 66.5–200.0)

## 2023-07-25 LAB — MAGNESIUM: Magnesium: 1.9 mg/dL (ref 1.6–2.3)

## 2023-07-25 LAB — VITAMIN D 25 HYDROXY (VIT D DEFICIENCY, FRACTURES): Vit D, 25-Hydroxy: 62.3 ng/mL (ref 30.0–100.0)

## 2023-08-07 DIAGNOSIS — Z713 Dietary counseling and surveillance: Secondary | ICD-10-CM | POA: Diagnosis not present

## 2023-09-04 ENCOUNTER — Ambulatory Visit: Payer: Medicaid Other | Admitting: Family Medicine

## 2023-09-16 ENCOUNTER — Ambulatory Visit: Payer: Medicaid Other | Admitting: Family Medicine

## 2023-10-02 ENCOUNTER — Ambulatory Visit: Payer: Medicaid Other | Admitting: Family Medicine

## 2023-10-05 ENCOUNTER — Encounter: Payer: Medicaid Other | Admitting: Family Medicine

## 2023-11-04 DIAGNOSIS — Z9884 Bariatric surgery status: Secondary | ICD-10-CM | POA: Diagnosis not present

## 2023-11-04 DIAGNOSIS — K912 Postsurgical malabsorption, not elsewhere classified: Secondary | ICD-10-CM | POA: Diagnosis not present

## 2023-11-06 DIAGNOSIS — B372 Candidiasis of skin and nail: Secondary | ICD-10-CM | POA: Diagnosis not present

## 2023-11-06 DIAGNOSIS — Z903 Acquired absence of stomach [part of]: Secondary | ICD-10-CM | POA: Insufficient documentation

## 2023-11-17 ENCOUNTER — Ambulatory Visit

## 2023-11-17 VITALS — BP 116/79 | HR 85 | Ht 66.0 in | Wt 242.0 lb

## 2023-11-17 DIAGNOSIS — L732 Hidradenitis suppurativa: Secondary | ICD-10-CM | POA: Diagnosis not present

## 2023-11-17 DIAGNOSIS — R051 Acute cough: Secondary | ICD-10-CM | POA: Diagnosis not present

## 2023-11-17 MED ORDER — DOXYCYCLINE HYCLATE 100 MG PO TABS
100.0000 mg | ORAL_TABLET | Freq: Two times a day (BID) | ORAL | 0 refills | Status: AC
Start: 2023-11-17 — End: 2023-11-24

## 2023-11-17 NOTE — Progress Notes (Unsigned)
 Acute Office Visit  Subjective:     Patient ID: [name] Silva, female    DOB: March 06, 1976, 48 y.o.   MRN: [id number]  Chief Complaint  Patient presents with   Care Management    Pt states "Cough and congestion for 10 days no fever" Pt also states "Bumps under her breasts"    HPI Patient is in today for Cough: Patient complains of nasal congestion and nonproductive cough.  Symptoms began 10 days ago.  The cough is non-productive, without wheezing, dyspnea or hemoptysis and is aggravated by nothing Associated symptoms include:postnasal drip. Patient does not have new pets. Patient does not have a history of asthma. Patient does not have a history of environmental allergens. Patient has not recent travel. Patient does have a history of smoking. Patient  has not previous Chest X-ray. Patient has not had a PPD done.  Patient reports of recurrence of skin lesions under her breast.  These get inflamed and tender.  She states that she has had these for years and has scarring from past episodes.  She currently has active lesions that are causing her pain and discomfort and are oozing.  She denies previous treatment for this.  ROS      Objective:    BP 116/79 (BP Location: Right Arm, Patient Position: Sitting, Cuff Size: Large)   Pulse 85   Ht 5\' 6"  (1.676 m)   Wt 242 lb 0.6 oz (109.8 kg)   SpO2 96%   BMI 39.07 kg/m  BP Readings from Last 3 Encounters:  11/17/23 116/79  05/15/23 116/75  03/12/23 128/73   Wt Readings from Last 3 Encounters:  11/17/23 242 lb 0.6 oz (109.8 kg)  05/15/23 278 lb (126.1 kg)  03/12/23 294 lb (133.4 kg)      Physical Exam Vitals and nursing note reviewed.  Constitutional:      Appearance: Normal appearance.  HENT:     Head: Normocephalic.     Right Ear: Tympanic membrane, ear canal and external ear normal.     Left Ear: Tympanic membrane, ear canal and external ear normal.     Nose: Nose normal.     Mouth/Throat:     Mouth: Mucous membranes are  moist.     Pharynx: Oropharynx is clear.  Eyes:     Extraocular Movements: Extraocular movements intact.     Pupils: Pupils are equal, round, and reactive to light.  Cardiovascular:     Rate and Rhythm: Normal rate and regular rhythm.  Pulmonary:     Effort: Pulmonary effort is normal.     Breath sounds: Normal breath sounds.  Musculoskeletal:     Cervical back: Normal range of motion and neck supple.  Skin:    General: Skin is warm and dry.     Findings: Abscess (under bilat breasts) and erythema present.  Neurological:     Mental Status: She is alert and oriented to person, place, and time.  Psychiatric:        Mood and Affect: Mood normal.        Thought Content: Thought content normal.     No results found for any visits on 11/17/23.      Assessment & Plan:   Problem List Items Addressed This Visit       Musculoskeletal and Integument   Hidradenitis suppurativa - Primary   Chronic in nature we will add chronic in nature, will add Doxy for acute flare.  Discussed using antibacterial soap in this area, can even  use Hibiclens.  Also recommend keeping this area dry.  Recommend dermatology consult if no improvement with above treatment.      Relevant Medications   doxycycline (VIBRA-TABS) 100 MG tablet   Other Visit Diagnoses       Acute cough       Improving per patient report.  Unremarkable exam in office today.  Recommend over-the-counter cough suppressant or allergy medicine for symptom relief.   Relevant Medications   doxycycline (VIBRA-TABS) 100 MG tablet       Meds ordered this encounter  Medications   doxycycline (VIBRA-TABS) 100 MG tablet    Sig: Take 1 tablet (100 mg total) by mouth 2 (two) times daily for 7 days.    Dispense:  14 tablet    Refill:  0    No follow-ups on file.  Darral Dash, FNP

## 2023-11-18 DIAGNOSIS — L732 Hidradenitis suppurativa: Secondary | ICD-10-CM | POA: Insufficient documentation

## 2023-11-18 NOTE — Assessment & Plan Note (Signed)
 Chronic in nature we will add chronic in nature, will add Doxy for acute flare.  Discussed using antibacterial soap in this area, can even use Hibiclens.  Also recommend keeping this area dry.  Recommend dermatology consult if no improvement with above treatment.

## 2023-11-20 ENCOUNTER — Ambulatory Visit: Payer: Medicaid Other | Admitting: Family Medicine

## 2023-12-20 ENCOUNTER — Other Ambulatory Visit: Payer: Self-pay | Admitting: Family Medicine

## 2023-12-21 ENCOUNTER — Encounter: Payer: Self-pay | Admitting: Family Medicine

## 2023-12-21 ENCOUNTER — Other Ambulatory Visit: Payer: Self-pay

## 2023-12-22 ENCOUNTER — Other Ambulatory Visit: Payer: Self-pay | Admitting: Family Medicine

## 2023-12-22 MED ORDER — CITALOPRAM HYDROBROMIDE 10 MG PO TABS: 10.0000 mg | ORAL_TABLET | Freq: Every day | ORAL | 1 refills | Status: DC

## 2023-12-22 NOTE — Telephone Encounter (Signed)
 sent

## 2023-12-24 ENCOUNTER — Ambulatory Visit: Admitting: Dermatology

## 2024-01-15 ENCOUNTER — Ambulatory Visit: Admitting: Family Medicine

## 2024-02-11 ENCOUNTER — Encounter: Payer: Self-pay | Admitting: Family Medicine

## 2024-02-11 ENCOUNTER — Ambulatory Visit: Admitting: Family Medicine

## 2024-02-11 VITALS — BP 110/74 | HR 69 | Ht 66.0 in | Wt 238.0 lb

## 2024-02-11 DIAGNOSIS — E782 Mixed hyperlipidemia: Secondary | ICD-10-CM

## 2024-02-11 DIAGNOSIS — Z1211 Encounter for screening for malignant neoplasm of colon: Secondary | ICD-10-CM

## 2024-02-11 DIAGNOSIS — W57XXXA Bitten or stung by nonvenomous insect and other nonvenomous arthropods, initial encounter: Secondary | ICD-10-CM

## 2024-02-11 DIAGNOSIS — S70361A Insect bite (nonvenomous), right thigh, initial encounter: Secondary | ICD-10-CM | POA: Diagnosis not present

## 2024-02-11 DIAGNOSIS — E038 Other specified hypothyroidism: Secondary | ICD-10-CM | POA: Diagnosis not present

## 2024-02-11 DIAGNOSIS — R399 Unspecified symptoms and signs involving the genitourinary system: Secondary | ICD-10-CM

## 2024-02-11 DIAGNOSIS — R7301 Impaired fasting glucose: Secondary | ICD-10-CM

## 2024-02-11 MED ORDER — DOXYCYCLINE HYCLATE 100 MG PO TABS: 100.0000 mg | ORAL_TABLET | Freq: Two times a day (BID) | ORAL | 0 refills | Status: AC

## 2024-02-11 NOTE — Assessment & Plan Note (Signed)
 Patient reports a tick bite on the right upper leg, with associated redness, firmness to touch, and persistent itching Started Doxycyline 100 mg twice daily x 7 days

## 2024-02-11 NOTE — Patient Instructions (Signed)

## 2024-02-11 NOTE — Progress Notes (Signed)
 Established Patient Office Visit   Subjective  Patient ID: [name] Silva, female    DOB: 1975/10/28  Age: 48 y.o. MRN: [id number]  Chief Complaint  Patient presents with   Hyperlipidemia    Six month follow up    Urinary Tract Infection    Straining to urinate    She  has a past medical history of Anxiety, Carpal tunnel syndrome of left wrist (06/2016), Dental crown present, Obesity, Sleep apnea, Smoker, and Wears glasses.  HPI Patient presents to the clinic for  follow up. For the details of today's visit, please refer to assessment and plan.  Review of Systems  Constitutional:  Negative for chills and fever.  Respiratory:  Negative for shortness of breath.   Cardiovascular:  Negative for chest pain.  Genitourinary:  Negative for dysuria, frequency, hematuria and urgency.  Neurological:  Negative for dizziness and headaches.      Objective:     BP 110/74   Pulse 69   Ht 5' 6 (1.676 m)   Wt 238 lb (108 kg)   SpO2 94%   BMI 38.41 kg/m  BP Readings from Last 3 Encounters:  02/11/24 110/74  11/17/23 116/79  05/15/23 116/75    Right leg  Physical Exam Vitals reviewed.  Constitutional:      General: She is not in acute distress.    Appearance: Normal appearance. She is not ill-appearing, toxic-appearing or diaphoretic.  HENT:     Head: Normocephalic.  Eyes:     General:        Right eye: No discharge.        Left eye: No discharge.     Conjunctiva/sclera: Conjunctivae normal.  Cardiovascular:     Rate and Rhythm: Normal rate.     Pulses: Normal pulses.     Heart sounds: Normal heart sounds.  Pulmonary:     Effort: Pulmonary effort is normal. No respiratory distress.     Breath sounds: Normal breath sounds.  Abdominal:     General: Bowel sounds are normal.     Palpations: Abdomen is soft.     Tenderness: There is no abdominal tenderness. There is no right CVA tenderness, left CVA tenderness or guarding.  Skin:    General: Skin is warm and dry.   Neurological:     Mental Status: She is alert.     Coordination: Coordination normal.     Gait: Gait normal.  Psychiatric:        Mood and Affect: Mood normal.        Behavior: Behavior normal.      No results found for any visits on 02/11/24.  The 10-year ASCVD risk score (Arnett DK, et al., 2019) is: 1%    Assessment & Plan:  Urinary symptom or sign Assessment & Plan: Patient reports Initially experienced difficulty with urine flow and a sensation of incomplete bladder emptying Urinalysis, urine culture ordered- Awaiting results will follow up.  If urine culture negative and symptoms persist referral to Urology for further evaluation   Orders: -     UA/M w/rflx Culture, Routine  IFG (impaired fasting glucose) -     Hemoglobin A1c  TSH (thyroid-stimulating hormone deficiency) -     TSH + free T4  Mixed hyperlipidemia Assessment & Plan: Previous LDL 176 Repeat Panel done today Cholesterol levels elevated, Discussed Managing high cholesterol involves a combination of lifestyle changes and medication. Plan of treatment will include Lifestyle changes  Lifestyle Changes include Reducing Saturated and Trans Fats,  Limit intake of red meat, full-fat dairy products, fried foods, and processed foods.  Increase Soluble Fiber: Foods like oats, beans, lentils, fruits, and vegetables can help lower LDL cholesterol.  Regular Physical Activity:  Aim for at least 150 minutes of moderate aerobic exercise or 75 minutes of vigorous exercise per week. Activities like walking, jogging, swimming, and cycling can help raise HDL (good) cholesterol and lower LDL cholesterol.     Orders: -     Lipid panel -     CMP14+EGFR -     CBC with Differential/Platelet  Screen for colon cancer -     Cologuard  Tick bite of right thigh, initial encounter Assessment & Plan: Patient reports a tick bite on the right upper leg, with associated redness, firmness to touch, and persistent  itching Started Doxycyline 100 mg twice daily x 7 days   Other orders -     Doxycycline  Hyclate; Take 1 tablet (100 mg total) by mouth 2 (two) times daily for 7 days.  Dispense: 14 tablet; Refill: 0    Return in about 6 months (around 08/13/2024), or if symptoms worsen or fail to improve, for hyperlipidemia.   Hilario Kidd Wilhelmena Falter, FNP

## 2024-02-11 NOTE — Assessment & Plan Note (Signed)
 Patient reports Initially experienced difficulty with urine flow and a sensation of incomplete bladder emptying Urinalysis, urine culture ordered- Awaiting results will follow up.  If urine culture negative and symptoms persist referral to Urology for further evaluation

## 2024-02-11 NOTE — Assessment & Plan Note (Signed)
 Previous LDL 176 Repeat Panel done today Cholesterol levels elevated, Discussed Managing high cholesterol involves a combination of lifestyle changes and medication. Plan of treatment will include Lifestyle changes  Lifestyle Changes include Reducing Saturated and Trans Fats,  Limit intake of red meat, full-fat dairy products, fried foods, and processed foods.  Increase Soluble Fiber: Foods like oats, beans, lentils, fruits, and vegetables can help lower LDL cholesterol.  Regular Physical Activity:  Aim for at least 150 minutes of moderate aerobic exercise or 75 minutes of vigorous exercise per week. Activities like walking, jogging, swimming, and cycling can help raise HDL (good) cholesterol and lower LDL cholesterol.

## 2024-02-14 LAB — MICROSCOPIC EXAMINATION
Casts: NONE SEEN /LPF
RBC, Urine: NONE SEEN /HPF (ref 0–2)

## 2024-02-14 LAB — URINE CULTURE, REFLEX

## 2024-02-14 LAB — UA/M W/RFLX CULTURE, ROUTINE
Bilirubin, UA: NEGATIVE
Glucose, UA: NEGATIVE
Ketones, UA: NEGATIVE
Nitrite, UA: NEGATIVE
Protein,UA: NEGATIVE
RBC, UA: NEGATIVE
Specific Gravity, UA: 1.027 (ref 1.005–1.030)
Urobilinogen, Ur: 1 mg/dL (ref 0.2–1.0)
pH, UA: 5.5 (ref 5.0–7.5)

## 2024-02-15 ENCOUNTER — Encounter: Payer: Self-pay | Admitting: Family Medicine

## 2024-02-16 ENCOUNTER — Other Ambulatory Visit: Payer: Self-pay | Admitting: Family Medicine

## 2024-02-16 DIAGNOSIS — R339 Retention of urine, unspecified: Secondary | ICD-10-CM

## 2024-02-16 NOTE — Telephone Encounter (Signed)
 sent

## 2024-02-25 IMAGING — MG MM DIGITAL SCREENING BILAT W/ TOMO AND CAD
6 of 10 series · 6 of 30 positions shown · non-contrast
Comparison: None available.

ACR Breast Density Category a: The breast tissue is almost entirely
fatty.

CLINICAL DATA: Screening. Baseline.

EXAM:
DIGITAL SCREENING BILATERAL MAMMOGRAM WITH TOMOSYNTHESIS AND CAD
TECHNIQUE: Bilateral screening digital craniocaudal and mediolateral oblique
mammograms were obtained. Bilateral screening digital breast
tomosynthesis was performed. The images were evaluated with
computer-aided detection.

[R CC synth-2D (1 of 2)]
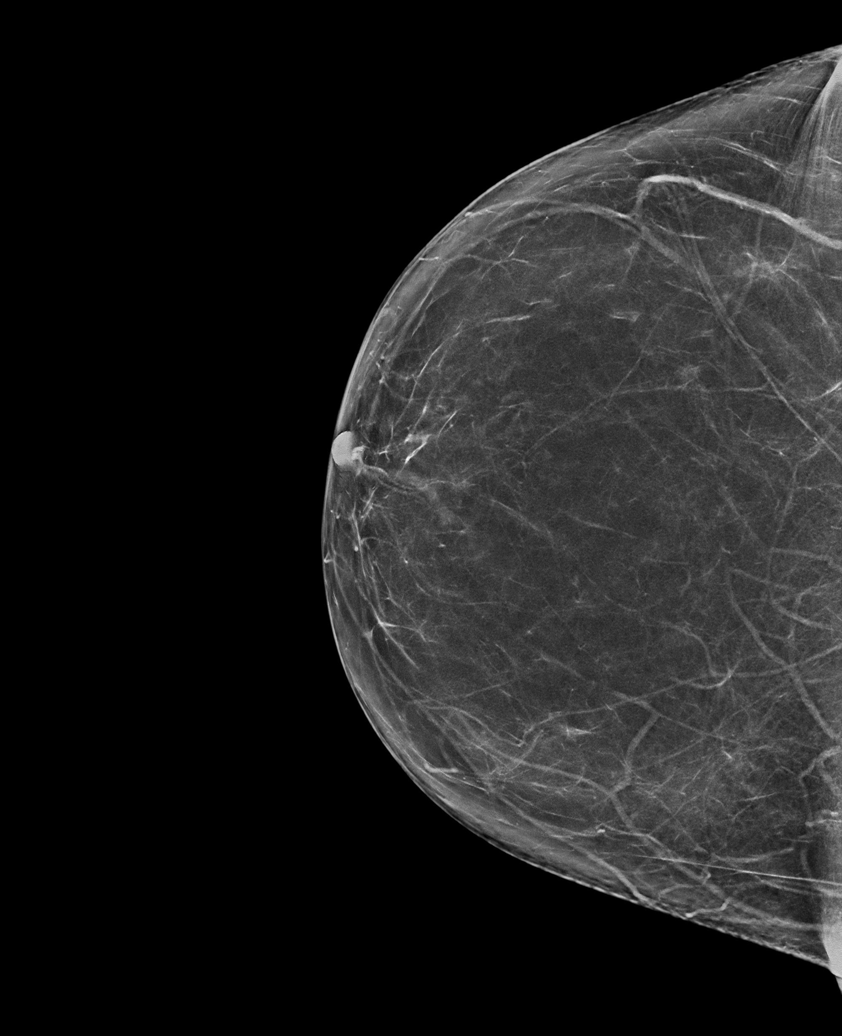

[R MLO synth-2D]
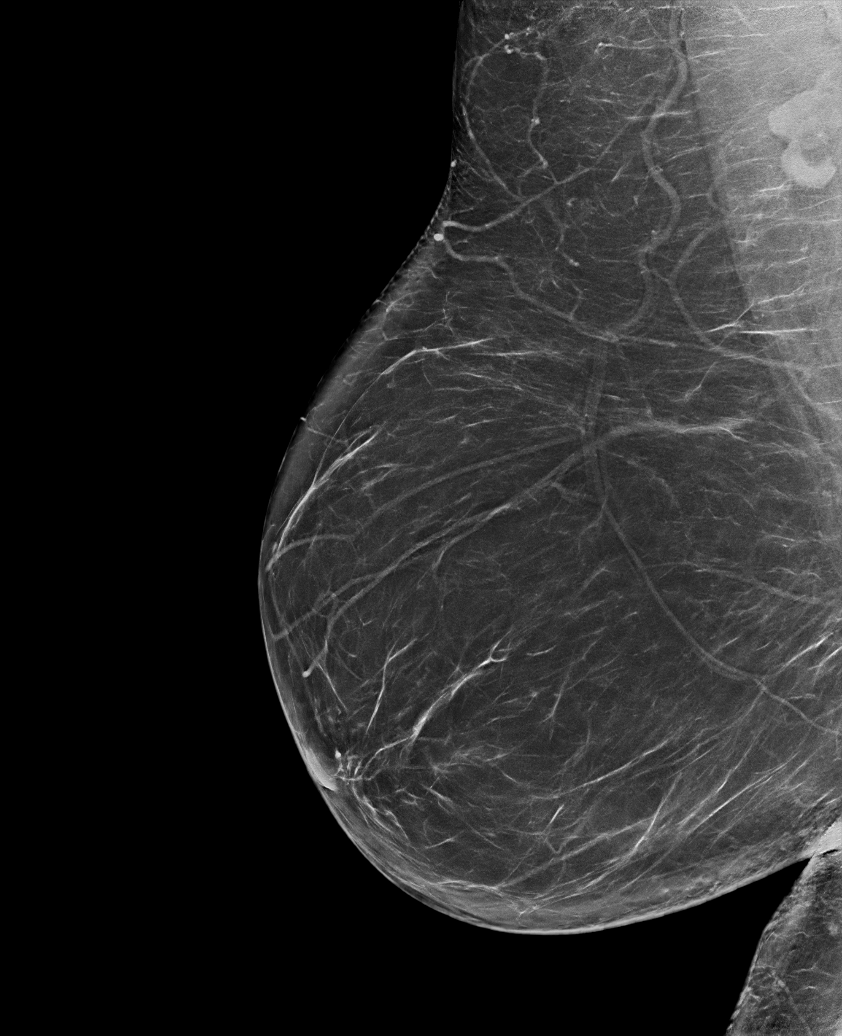

[L CC synth-2D]
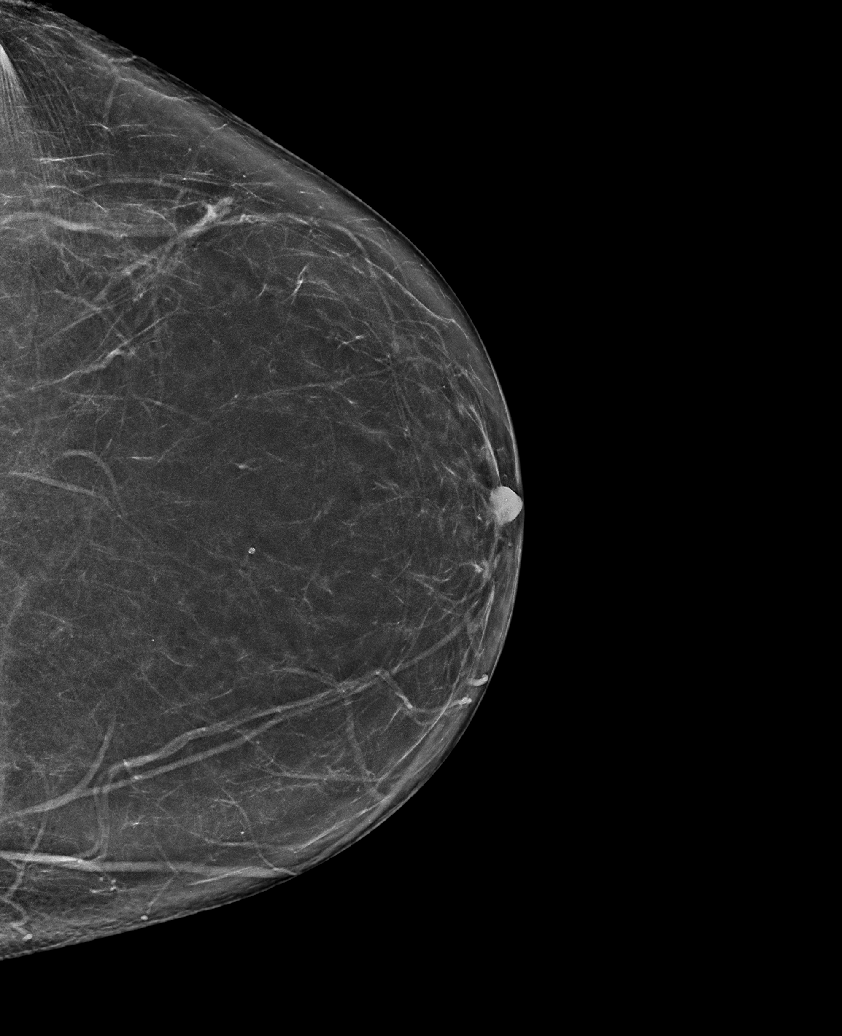

[R CC synth-2D (2 of 2)]
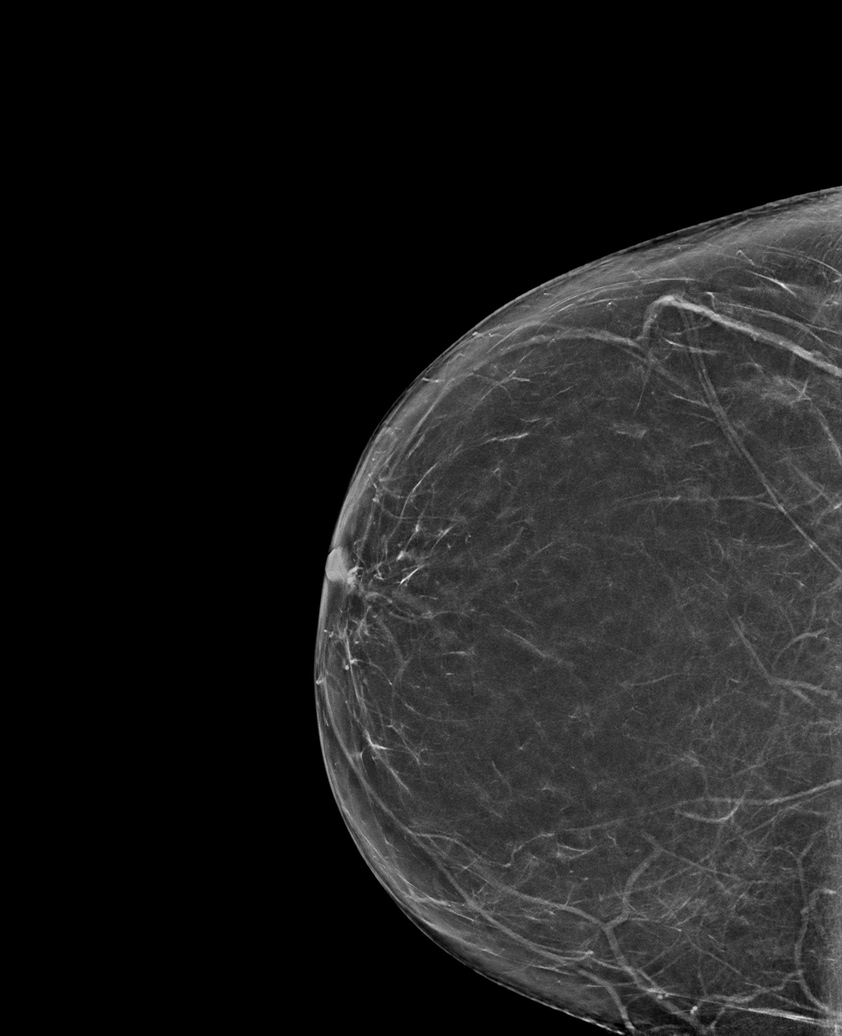

[L MLO synth-2D]
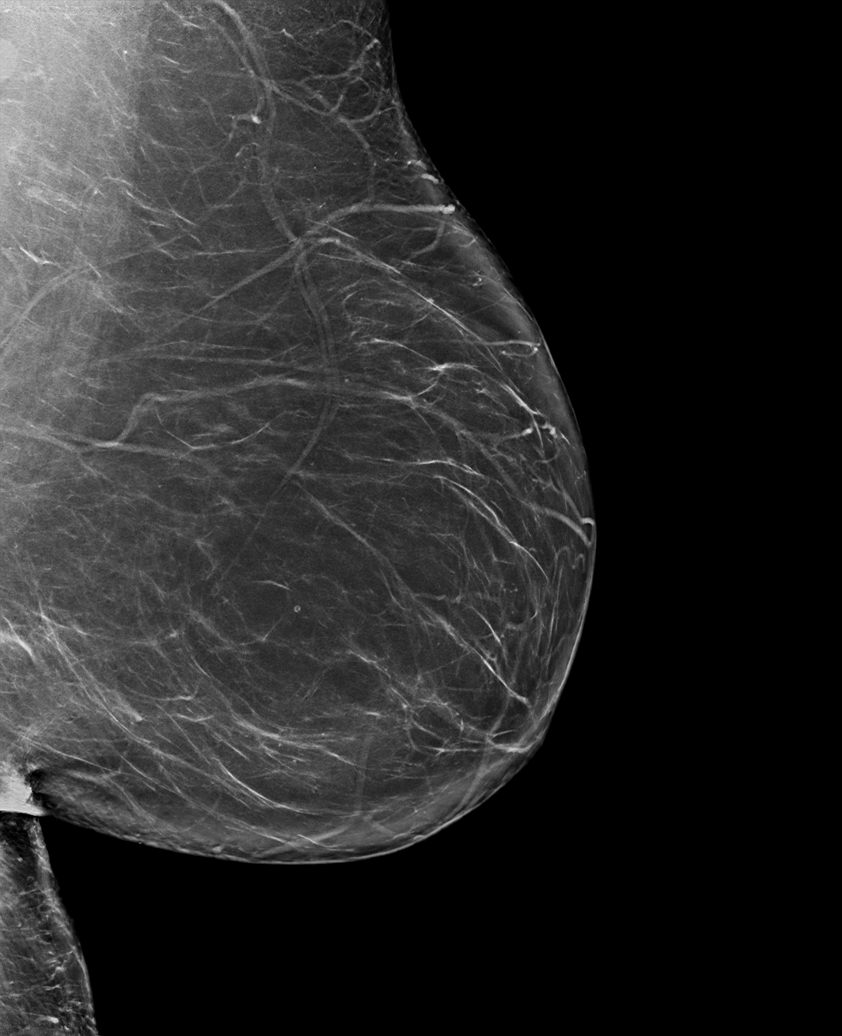

[R CC tomo · tomo slice 39/76.0]
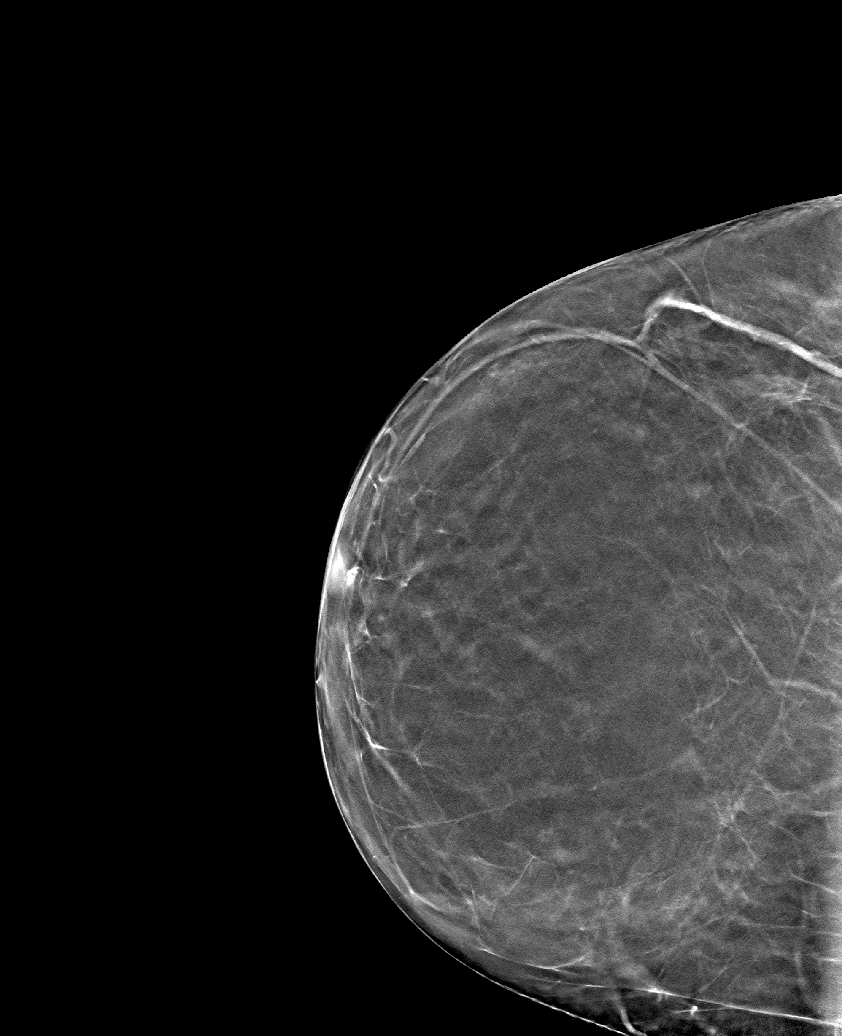

[6 of 30 positions shown; findings below may reference images not displayed]

FINDINGS: There are no findings suspicious for malignancy.
IMPRESSION: No mammographic evidence of malignancy. A result letter of this
screening mammogram will be mailed directly to the patient.

RECOMMENDATION:
Screening mammogram in one year. (Code:GV-C-QIE)

BI-RADS CATEGORY  1: Negative.

## 2024-03-02 LAB — COLOGUARD: COLOGUARD: NEGATIVE

## 2024-03-03 ENCOUNTER — Ambulatory Visit: Payer: Self-pay | Admitting: Family Medicine

## 2024-03-07 ENCOUNTER — Ambulatory Visit: Admitting: Dermatology

## 2024-03-14 ENCOUNTER — Encounter: Payer: Self-pay | Admitting: Family Medicine

## 2024-03-21 DIAGNOSIS — E038 Other specified hypothyroidism: Secondary | ICD-10-CM | POA: Diagnosis not present

## 2024-03-21 DIAGNOSIS — R399 Unspecified symptoms and signs involving the genitourinary system: Secondary | ICD-10-CM | POA: Diagnosis not present

## 2024-03-21 DIAGNOSIS — E782 Mixed hyperlipidemia: Secondary | ICD-10-CM | POA: Diagnosis not present

## 2024-03-21 DIAGNOSIS — R7301 Impaired fasting glucose: Secondary | ICD-10-CM | POA: Diagnosis not present

## 2024-03-22 ENCOUNTER — Encounter: Payer: Self-pay | Admitting: Family Medicine

## 2024-03-22 LAB — TSH+FREE T4
Free T4: 1.1 ng/dL (ref 0.82–1.77)
TSH: 2.48 u[IU]/mL (ref 0.450–4.500)

## 2024-03-22 LAB — CMP14+EGFR
ALT: 11 IU/L (ref 0–32)
AST: 16 IU/L (ref 0–40)
Albumin: 4.2 g/dL (ref 3.9–4.9)
Alkaline Phosphatase: 96 IU/L (ref 44–121)
BUN/Creatinine Ratio: 16 (ref 9–23)
BUN: 14 mg/dL (ref 6–24)
Bilirubin Total: 0.4 mg/dL (ref 0.0–1.2)
CO2: 23 mmol/L (ref 20–29)
Calcium: 9.5 mg/dL (ref 8.7–10.2)
Chloride: 100 mmol/L (ref 96–106)
Creatinine, Ser: 0.89 mg/dL (ref 0.57–1.00)
Globulin, Total: 2.2 g/dL (ref 1.5–4.5)
Glucose: 83 mg/dL (ref 70–99)
Potassium: 4.2 mmol/L (ref 3.5–5.2)
Sodium: 139 mmol/L (ref 134–144)
Total Protein: 6.4 g/dL (ref 6.0–8.5)
eGFR: 81 mL/min/1.73 (ref >59)

## 2024-03-22 LAB — LIPID PANEL
Chol/HDL Ratio: 4 ratio (ref 0.0–4.4)
Cholesterol, Total: 225 mg/dL — ABNORMAL HIGH (ref 100–199)
HDL: 56 mg/dL (ref >39)
LDL Chol Calc (NIH): 148 mg/dL — ABNORMAL HIGH (ref 0–99)
Triglycerides: 119 mg/dL (ref 0–149)
VLDL Cholesterol Cal: 21 mg/dL (ref 5–40)

## 2024-03-22 LAB — CBC WITH DIFFERENTIAL/PLATELET
Basophils Absolute: 0.1 x10E3/uL (ref 0.0–0.2)
Basos: 1 %
EOS (ABSOLUTE): 0.2 x10E3/uL (ref 0.0–0.4)
Eos: 2 %
Hematocrit: 46.5 % (ref 34.0–46.6)
Hemoglobin: 15 g/dL (ref 11.1–15.9)
Immature Grans (Abs): 0 x10E3/uL (ref 0.0–0.1)
Immature Granulocytes: 0 %
Lymphocytes Absolute: 2.4 x10E3/uL (ref 0.7–3.1)
Lymphs: 32 %
MCH: 32.5 pg (ref 26.6–33.0)
MCHC: 32.3 g/dL (ref 31.5–35.7)
MCV: 101 fL — ABNORMAL HIGH (ref 79–97)
Monocytes Absolute: 0.6 x10E3/uL (ref 0.1–0.9)
Monocytes: 7 %
Neutrophils Absolute: 4.4 x10E3/uL (ref 1.4–7.0)
Neutrophils: 58 %
Platelets: 239 x10E3/uL (ref 150–450)
RBC: 4.62 x10E6/uL (ref 3.77–5.28)
RDW: 12.8 % (ref 11.7–15.4)
WBC: 7.6 x10E3/uL (ref 3.4–10.8)

## 2024-03-22 LAB — HEMOGLOBIN A1C
Est. average glucose Bld gHb Est-mCnc: 111 mg/dL
Hgb A1c MFr Bld: 5.5 % (ref 4.8–5.6)

## 2024-03-23 ENCOUNTER — Other Ambulatory Visit: Payer: Self-pay | Admitting: Family Medicine

## 2024-03-23 MED ORDER — ALPRAZOLAM 0.5 MG PO TABS: 0.5000 mg | ORAL_TABLET | Freq: Every day | ORAL | 0 refills | Status: DC | PRN

## 2024-03-23 NOTE — Telephone Encounter (Signed)
 sent

## 2024-04-01 ENCOUNTER — Encounter (HOSPITAL_COMMUNITY): Payer: Self-pay

## 2024-04-08 ENCOUNTER — Encounter (HOSPITAL_COMMUNITY)

## 2024-04-08 DIAGNOSIS — Z1231 Encounter for screening mammogram for malignant neoplasm of breast: Secondary | ICD-10-CM

## 2024-04-18 ENCOUNTER — Encounter: Payer: Self-pay | Admitting: Urology

## 2024-04-18 ENCOUNTER — Ambulatory Visit (INDEPENDENT_AMBULATORY_CARE_PROVIDER_SITE_OTHER): Admitting: Urology

## 2024-04-18 VITALS — BP 125/81 | HR 68

## 2024-04-18 DIAGNOSIS — R82998 Other abnormal findings in urine: Secondary | ICD-10-CM

## 2024-04-18 DIAGNOSIS — R339 Retention of urine, unspecified: Secondary | ICD-10-CM

## 2024-04-18 DIAGNOSIS — N200 Calculus of kidney: Secondary | ICD-10-CM

## 2024-04-18 LAB — URINALYSIS, ROUTINE W REFLEX MICROSCOPIC
Bilirubin, UA: NEGATIVE
Glucose, UA: NEGATIVE
Ketones, UA: NEGATIVE
Nitrite, UA: NEGATIVE
Protein,UA: NEGATIVE
RBC, UA: NEGATIVE
Specific Gravity, UA: 1.02 (ref 1.005–1.030)
Urobilinogen, Ur: 1 mg/dL (ref 0.2–1.0)
pH, UA: 7 (ref 5.0–7.5)

## 2024-04-18 LAB — MICROSCOPIC EXAMINATION: Epithelial Cells (non renal): >10 /HPF — AB (ref 0–10)

## 2024-04-18 NOTE — Progress Notes (Signed)
 04/18/2024 2:01 PM   [name] Silva 1976-01-19 [id number]  Referring provider: Terry Wilhelmena Lloyd Hilario, FNP [phone] [address]. [address] [place],  [place] [address]  Concern for nephrolithiasis   HPI: [name] Silva is a 48yo here for evaluation of nephrolithiasis. She has had UA microscopy which showed CaOx crystals. UA today shows epithelials, 6-10 WBCs, and moderate bacteria. No family hx and no person hx of stone events. She has issues with straining to urinate and splaying of her urinary stream. At that time she had a UA with CaOx crystals.  No prior abdominal imaging. No hx of gross hematuria.    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   Dental crown present    Obesity    Sleep apnea    no CPAP use, mild   Smoker    Wears glasses     Surgical History: Past Surgical History:  Procedure Laterality Date   BILATERAL SALPINGECTOMY Bilateral 01/09/2017   Procedure: LAPAROSCOPIC SALPINGECTOMY;  Surgeon: Horacio Boas, MD;  Location: [facility];  Service: Gynecology;  Laterality: Bilateral;   CARPAL TUNNEL RELEASE Left 07/08/2016   Procedure: CARPAL TUNNEL RELEASE, left;  Surgeon: Arley Curia, MD;  Location: [facility];  Service: Orthopedics;  Laterality: Left;  FAB   CARPAL TUNNEL RELEASE Right 07/10/2017   CESAREAN SECTION  04/10/2006    Home Medications:  Allergies as of 04/18/2024       Reactions   Amoxicillin-pot Clavulanate Rash        Medication List        Accurate as of April 18, 2024  2:01 PM. If you have any questions, ask your nurse or doctor.          ALPRAZolam  0.5 MG tablet Commonly known as: XANAX  Take 1 tablet (0.5 mg total) by mouth daily as needed for anxiety.   citalopram  10 MG tablet Commonly known as: CELEXA  Take 1 tablet (10 mg total) by mouth daily.   meclizine  12.5 MG tablet Commonly known as: ANTIVERT  Take 1 tablet (12.5 mg total) by mouth 3 (three) times daily as  needed for dizziness.   Vitamin D  50 MCG (2000 UT) Caps Take 1 capsule by mouth daily.        Allergies:  Allergies  Allergen Reactions   Amoxicillin-Pot Clavulanate Rash    Family History: Family History  Problem Relation Age of Onset   COPD Mother    Lumbar disc disease Mother    Cancer Mother        cervical cancer; laryngeal CA with mets    Anxiety disorder Mother    Depression Mother    Diabetes Father    Hypertension Father    Hyperlipidemia Father    COPD Maternal Uncle        lung   Cancer Maternal Uncle        lung cancer   Stroke Maternal Grandmother    Heart disease Maternal Grandfather     Social History:  reports that she quit smoking about 13 months ago. Her smoking use included cigarettes. She has a 10 pack-year smoking history. She has never used smokeless tobacco. She reports current alcohol use of about 9.0 - 16.0 standard drinks of alcohol per week. She reports that she does not use drugs.  ROS: All other review of systems were reviewed and are negative except what is noted above in HPI  Physical Exam: BP 125/81  Pulse 68   Constitutional:  Alert and oriented, No acute distress. HEENT:  AT, moist mucus membranes.  Trachea midline, no masses. Cardiovascular: No clubbing, cyanosis, or edema. Respiratory: Normal respiratory effort, no increased work of breathing. GI: Abdomen is soft, nontender, nondistended, no abdominal masses GU: No CVA tenderness.  Lymph: No cervical or inguinal lymphadenopathy. Skin: No rashes, bruises or suspicious lesions. Neurologic: Grossly intact, no focal deficits, moving all 4 extremities. Psychiatric: Normal mood and affect.  Laboratory Data: Lab Results  Component Value Date   WBC 7.6 03/21/2024   HGB 15.0 03/21/2024   HCT 46.5 03/21/2024   MCV 101 (H) 03/21/2024   PLT 239 03/21/2024    Lab Results  Component Value Date   CREATININE 0.89 03/21/2024    No results found for: PSA  No results found  for: TESTOSTERONE  Lab Results  Component Value Date   HGBA1C 5.5 03/21/2024    Urinalysis    Component Value Date/Time   APPEARANCEUR Clear 02/11/2024 1520   LABSPEC 1.025 09/30/2022 1433   GLUCOSEU Negative 02/11/2024 1520   BILIRUBINUR Negative 02/11/2024 1520   KETONESUR trace (5) (A) 09/30/2022 1433   PROTEINUR Negative 02/11/2024 1520   UROBILINOGEN negative 11/05/2016 1454   NITRITE Negative 02/11/2024 1520   LEUKOCYTESUR 2+ (A) 02/11/2024 1520    Lab Results  Component Value Date   LABMICR See below: 02/11/2024   WBCUA 0-5 02/11/2024   LABEPIT 0-10 02/11/2024   BACTERIA Moderate (A) 02/11/2024    Pertinent Imaging:  No results found for this or any previous visit.  No results found for this or any previous visit.  No results found for this or any previous visit.  No results found for this or any previous visit.  No results found for this or any previous visit.  No results found for this or any previous visit.  No results found for this or any previous visit.  No results found for this or any previous visit.   Assessment & Plan:    1. CaOx nephrolithiasis -CT stone study - Urinalysis, Routine w reflex microscopic   No follow-ups on file.  Belvie Clara, MD  [facility] [place]

## 2024-04-18 NOTE — Patient Instructions (Signed)

## 2024-04-21 ENCOUNTER — Other Ambulatory Visit: Payer: Self-pay | Admitting: Family Medicine

## 2024-04-21 ENCOUNTER — Other Ambulatory Visit (HOSPITAL_COMMUNITY): Payer: Self-pay | Admitting: Family Medicine

## 2024-04-21 ENCOUNTER — Encounter: Payer: Self-pay | Admitting: Family Medicine

## 2024-04-21 DIAGNOSIS — Z1231 Encounter for screening mammogram for malignant neoplasm of breast: Secondary | ICD-10-CM

## 2024-04-21 MED ORDER — DOXYCYCLINE HYCLATE 100 MG PO TABS: 100.0000 mg | ORAL_TABLET | Freq: Two times a day (BID) | ORAL | 0 refills | Status: AC

## 2024-04-21 NOTE — Telephone Encounter (Signed)
 Please let the patient know that I have reviewed her picture. Her skin appears erythematous and concerning for a skin infection, so I have sent in a prescription for doxycycline 

## 2024-04-25 ENCOUNTER — Encounter (HOSPITAL_COMMUNITY)

## 2024-04-25 DIAGNOSIS — Z1231 Encounter for screening mammogram for malignant neoplasm of breast: Secondary | ICD-10-CM

## 2024-05-04 ENCOUNTER — Other Ambulatory Visit (HOSPITAL_COMMUNITY): Payer: Self-pay | Admitting: Family Medicine

## 2024-05-04 DIAGNOSIS — Z1231 Encounter for screening mammogram for malignant neoplasm of breast: Secondary | ICD-10-CM

## 2024-05-06 DIAGNOSIS — K219 Gastro-esophageal reflux disease without esophagitis: Secondary | ICD-10-CM | POA: Diagnosis not present

## 2024-05-06 DIAGNOSIS — E669 Obesity, unspecified: Secondary | ICD-10-CM | POA: Diagnosis not present

## 2024-05-06 DIAGNOSIS — Z903 Acquired absence of stomach [part of]: Secondary | ICD-10-CM | POA: Diagnosis not present

## 2024-05-06 DIAGNOSIS — B372 Candidiasis of skin and nail: Secondary | ICD-10-CM | POA: Diagnosis not present

## 2024-05-09 ENCOUNTER — Ambulatory Visit: Admitting: Obstetrics & Gynecology

## 2024-05-09 ENCOUNTER — Ambulatory Visit (HOSPITAL_COMMUNITY)
Admission: RE | Admit: 2024-05-09 | Discharge: 2024-05-09 | Disposition: A | Discharge status: Home / Self Care | Source: Ambulatory Visit | Attending: Family Medicine | Admitting: Family Medicine

## 2024-05-09 ENCOUNTER — Encounter (HOSPITAL_COMMUNITY): Payer: Self-pay

## 2024-05-09 DIAGNOSIS — Z1231 Encounter for screening mammogram for malignant neoplasm of breast: Secondary | ICD-10-CM | POA: Diagnosis not present

## 2024-05-20 ENCOUNTER — Ambulatory Visit (HOSPITAL_COMMUNITY)
Admission: RE | Admit: 2024-05-20 | Discharge: 2024-05-20 | Disposition: A | Discharge status: Home / Self Care | Source: Ambulatory Visit | Attending: Urology | Admitting: Urology

## 2024-05-20 DIAGNOSIS — K5730 Diverticulosis of large intestine without perforation or abscess without bleeding: Secondary | ICD-10-CM | POA: Diagnosis not present

## 2024-05-20 DIAGNOSIS — N200 Calculus of kidney: Secondary | ICD-10-CM | POA: Diagnosis not present

## 2024-05-20 DIAGNOSIS — N209 Urinary calculus, unspecified: Secondary | ICD-10-CM | POA: Diagnosis not present

## 2024-05-25 ENCOUNTER — Other Ambulatory Visit: Payer: Self-pay

## 2024-05-25 DIAGNOSIS — M545 Low back pain, unspecified: Secondary | ICD-10-CM

## 2024-05-26 ENCOUNTER — Telehealth: Payer: Self-pay | Admitting: Orthopedic Surgery

## 2024-05-26 NOTE — Telephone Encounter (Signed)
 We received a referral for this patient to come and see us  for Midline LBP.  She had a CT Renal Stone Study at [facility] on 05/20/24, it shows:  Chronic appearing bilateral L5 pars defects with minimal grade 1 anterolisthesis of L5 on S1.  Can you see her, please advise.  [phone]

## 2024-05-27 NOTE — Telephone Encounter (Signed)
 I spoke w/the pt yesterday and advised, she verbalized understanding.  I have updated the referral for [facility].

## 2024-06-09 ENCOUNTER — Other Ambulatory Visit: Payer: Self-pay | Admitting: Family Medicine

## 2024-06-09 ENCOUNTER — Encounter: Payer: Self-pay | Admitting: Family Medicine

## 2024-06-12 ENCOUNTER — Other Ambulatory Visit: Payer: Self-pay | Admitting: Family Medicine

## 2024-06-12 DIAGNOSIS — F411 Generalized anxiety disorder: Secondary | ICD-10-CM

## 2024-06-12 MED ORDER — ALPRAZOLAM 0.5 MG PO TABS: 0.5000 mg | ORAL_TABLET | Freq: Every day | ORAL | 0 refills | Status: AC | PRN

## 2024-06-12 MED ORDER — CITALOPRAM HYDROBROMIDE 10 MG PO TABS: 10.0000 mg | ORAL_TABLET | Freq: Every day | ORAL | 1 refills | Status: AC

## 2024-06-13 ENCOUNTER — Telehealth: Payer: Self-pay

## 2024-06-13 NOTE — Telephone Encounter (Signed)
 Copied from CRM [id number]. Topic: Clinical - Prescription Issue >> Jun 13, 2024 10:24 AM Tobias CROME wrote: Reason for CRM: Patient states her prescription for alprazolam  was sent to the wrong pharmacy. Patient requesting for it to be sent to [facility] - [address] [place] [address] Phone: [phone] Fax: [phone] Hours: Not open 24 hours.  Patient states it was also sent in for only 21 days but patient is needing it to be sent in for a month supply, 30 days.   Patient requesting prescription be adjusted and sent to correct pharmacy.

## 2024-06-15 ENCOUNTER — Ambulatory Visit: Admitting: Internal Medicine

## 2024-06-15 ENCOUNTER — Encounter: Payer: Self-pay | Admitting: Internal Medicine

## 2024-06-15 VITALS — BP 112/74 | HR 90 | Temp 97.3°F | Ht 66.0 in | Wt 240.3 lb

## 2024-06-15 DIAGNOSIS — Z1211 Encounter for screening for malignant neoplasm of colon: Secondary | ICD-10-CM | POA: Diagnosis not present

## 2024-06-15 DIAGNOSIS — K219 Gastro-esophageal reflux disease without esophagitis: Secondary | ICD-10-CM | POA: Diagnosis not present

## 2024-06-15 DIAGNOSIS — K30 Functional dyspepsia: Secondary | ICD-10-CM

## 2024-06-15 MED ORDER — DEXLANSOPRAZOLE 60 MG PO CPDR: 60.0000 mg | DELAYED_RELEASE_CAPSULE | Freq: Every day | ORAL | 3 refills | Status: DC
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
## 2024-06-15 NOTE — Patient Instructions (Signed)
 I am going to change your omeprazole to dexlansoprazole 60 mg daily.  This medication works best to be taken 30 minutes before breakfast.  I am also going to check blood work at [facility] to screen you for celiac disease as well as alpha gal syndrome.  Follow-up in 8 weeks.  If not improved we will consider upper endoscopy to further evaluate.  It was very nice meeting you today.  Dr. Cindie

## 2024-06-21 ENCOUNTER — Telehealth: Payer: Self-pay

## 2024-06-21 NOTE — Telephone Encounter (Signed)
 PA done for Dexlansoprazole 60 mg on Cover My [employer]. Pt has tried / failed  Omeprazole 40 mg. Dx used: K21.9 [place]), K30 (indigestion). Waiting on a response from [employer].

## 2024-07-06 ENCOUNTER — Ambulatory Visit: Admitting: Orthopedic Surgery

## 2024-08-02 ENCOUNTER — Encounter: Payer: Self-pay | Admitting: Internal Medicine

## 2024-08-08 ENCOUNTER — Encounter: Payer: Self-pay | Admitting: Internal Medicine

## 2024-08-12 ENCOUNTER — Ambulatory Visit

## 2024-08-12 ENCOUNTER — Encounter: Payer: Self-pay | Admitting: Obstetrics & Gynecology

## 2024-08-12 ENCOUNTER — Ambulatory Visit: Admitting: Obstetrics & Gynecology

## 2024-08-12 VITALS — BP 119/79 | HR 73 | Ht 66.0 in | Wt 233.2 lb

## 2024-08-12 DIAGNOSIS — N951 Menopausal and female climacteric states: Secondary | ICD-10-CM | POA: Diagnosis not present

## 2024-08-12 NOTE — Progress Notes (Signed)
" ° °  GYN VISIT Patient name: [name] Silva MRN [id number]  Date of birth: 23-Apr-1976 Chief Complaint:   Menopause  History of Present Illness:   [name] Silva is a 49 y.o. G1P1  female being seen today for concerns regarding:  Concern about menopause- .     No period x 3 mos -Sweating at night- maybe 3x per week -Not really having hot flashes -denies mood changes or other complaints -rates symptoms 3/10  Prior to this,  menses were fairly regular, but had started to space every 45 days.    Patient's last menstrual period was 04/25/2024.    Review of Systems:   Pertinent items are noted in HPI Denies fever/chills, dizziness, headaches, visual disturbances, fatigue, shortness of breath, chest pain, abdominal pain, vomiting. 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 Physical Assessment:   Vitals:   08/12/24 1131  BP: 119/79  Pulse: 73  Weight: 233 lb 3.2 oz (105.8 kg)  Height: 5' 6 (1.676 m)  Body mass index is 37.64 kg/m.       Physical Examination:   General appearance: alert, well appearing, and in no distress  Psych: mood appropriate, normal affect  Skin: warm & dry   Cardiovascular: normal heart rate noted  Respiratory: normal  respiratory effort, no distress  Extremities: no edema   Chaperone: N/A    Assessment & Plan:  1) Perimenopause - Discussed menopause and perimenopausal symptoms such as vasomotor symptoms - Reviewed conservative options to help improve symptoms such as regular exercise, good sleep habits and cutting back on caffeine - Also reviewed over-the-counter herbal supplements that may help improve her symptoms - Briefly discussed medical management option should she note worsening of her symptoms these include many options ranging from low-dose SSRI, nonhormonal Veozah or Lynkuet to HRT - Patient's and concerns were addressed - For now plan to monitor symptoms and follow-up as needed - Plan for annual in July   No orders of the defined types were placed in this encounter.   Return for July 2026 annual.   Anaya Bovee, DO Attending Obstetrician & Gynecologist, [facility] for [facility]    "

## 2024-08-18 ENCOUNTER — Ambulatory Visit: Admitting: Dermatology

## 2024-08-22 ENCOUNTER — Ambulatory Visit: Admitting: Orthopedic Surgery

## 2024-09-22 ENCOUNTER — Ambulatory Visit: Admitting: Orthopedic Surgery

## 2024-10-04 ENCOUNTER — Ambulatory Visit: Admitting: Dermatology

## 2024-10-14 ENCOUNTER — Ambulatory Visit: Admitting: Physician Assistant

## 2024-11-16 ENCOUNTER — Ambulatory Visit: Admitting: Orthopedic Surgery
